# Patient Record
Sex: Female | Born: 1980 | Race: Asian | Hispanic: No | Marital: Married | Smoking: Never smoker
Health system: Southern US, Community
[De-identification: ages and names within clinical notes are randomized; demographics above are authoritative.]

## PROBLEM LIST (undated history)

## (undated) DIAGNOSIS — N809 Endometriosis, unspecified: Secondary | ICD-10-CM

## (undated) DIAGNOSIS — O98419 Viral hepatitis complicating pregnancy, unspecified trimester: Secondary | ICD-10-CM

## (undated) DIAGNOSIS — B1910 Unspecified viral hepatitis B without hepatic coma: Secondary | ICD-10-CM

## (undated) HISTORY — DX: Unspecified viral hepatitis B without hepatic coma: B19.10

## (undated) HISTORY — DX: Viral hepatitis complicating pregnancy, unspecified trimester: O98.419

## (undated) HISTORY — PX: NO PAST SURGERIES: SHX2092

---

## 2015-04-18 ENCOUNTER — Other Ambulatory Visit (HOSPITAL_COMMUNITY): Payer: Self-pay | Admitting: Obstetrics and Gynecology

## 2015-04-18 DIAGNOSIS — Z3141 Encounter for fertility testing: Secondary | ICD-10-CM

## 2015-04-21 ENCOUNTER — Ambulatory Visit (HOSPITAL_COMMUNITY)
Admission: RE | Admit: 2015-04-21 | Discharge: 2015-04-21 | Disposition: A | Discharge status: Home / Self Care | Payer: BLUE CROSS/BLUE SHIELD | Source: Ambulatory Visit | Attending: Obstetrics and Gynecology | Admitting: Obstetrics and Gynecology

## 2015-04-21 DIAGNOSIS — N979 Female infertility, unspecified: Secondary | ICD-10-CM | POA: Diagnosis present

## 2015-04-21 DIAGNOSIS — Z3141 Encounter for fertility testing: Secondary | ICD-10-CM

## 2015-04-21 MED ORDER — IOHEXOL 300 MG/ML  SOLN
30.0000 mL | Freq: Once | INTRAMUSCULAR | Status: DC | PRN
  Administered 2015-04-21: 4 mL
  Filled 2015-04-21: qty 30

## 2015-07-20 LAB — OB RESULTS CONSOLE RPR: RPR: NONREACTIVE

## 2015-07-20 LAB — OB RESULTS CONSOLE HIV ANTIBODY (ROUTINE TESTING): HIV: NONREACTIVE

## 2015-07-20 LAB — OB RESULTS CONSOLE RUBELLA ANTIBODY, IGM: Rubella: IMMUNE

## 2015-07-20 LAB — OB RESULTS CONSOLE GC/CHLAMYDIA
CHLAMYDIA, DNA PROBE: NEGATIVE
GC PROBE AMP, GENITAL: NEGATIVE

## 2015-07-24 LAB — OB RESULTS CONSOLE HEPATITIS B SURFACE ANTIGEN: Hepatitis B Surface Ag: NEGATIVE

## 2015-08-17 ENCOUNTER — Encounter: Payer: Self-pay | Admitting: Infectious Diseases

## 2015-08-17 ENCOUNTER — Ambulatory Visit (INDEPENDENT_AMBULATORY_CARE_PROVIDER_SITE_OTHER): Payer: BLUE CROSS/BLUE SHIELD | Admitting: Infectious Diseases

## 2015-08-17 ENCOUNTER — Telehealth: Payer: Self-pay | Admitting: *Deleted

## 2015-08-17 VITALS — BP 116/77 | HR 91 | Temp 98.3°F | Wt 123.0 lb

## 2015-08-17 DIAGNOSIS — B1910 Unspecified viral hepatitis B without hepatic coma: Secondary | ICD-10-CM | POA: Diagnosis not present

## 2015-08-17 DIAGNOSIS — O98419 Viral hepatitis complicating pregnancy, unspecified trimester: Secondary | ICD-10-CM | POA: Diagnosis not present

## 2015-08-17 LAB — COMPREHENSIVE METABOLIC PANEL
ALT: 14 U/L (ref 6–29)
AST: 15 U/L (ref 10–30)
Albumin: 3.7 g/dL (ref 3.6–5.1)
Alkaline Phosphatase: 33 U/L (ref 33–115)
BUN: 9 mg/dL (ref 7–25)
CO2: 19 mmol/L — ABNORMAL LOW (ref 20–31)
Calcium: 8.6 mg/dL (ref 8.6–10.2)
Chloride: 106 mmol/L (ref 98–110)
Creat: 0.53 mg/dL (ref 0.50–1.10)
Glucose, Bld: 111 mg/dL — ABNORMAL HIGH (ref 65–99)
Potassium: 3.9 mmol/L (ref 3.5–5.3)
Sodium: 137 mmol/L (ref 135–146)
Total Bilirubin: 0.3 mg/dL (ref 0.2–1.2)
Total Protein: 6.2 g/dL (ref 6.1–8.1)

## 2015-08-17 LAB — CBC
HCT: 36 % (ref 36.0–46.0)
Hemoglobin: 12.1 g/dL (ref 12.0–15.0)
MCH: 32.2 pg (ref 26.0–34.0)
MCHC: 33.6 g/dL (ref 30.0–36.0)
MCV: 95.7 fL (ref 78.0–100.0)
MPV: 11.1 fL (ref 8.6–12.4)
Platelets: 224 10*3/uL (ref 150–400)
RBC: 3.76 MIL/uL — ABNORMAL LOW (ref 3.87–5.11)
RDW: 13.6 % (ref 11.5–15.5)
WBC: 9.7 10*3/uL (ref 4.0–10.5)

## 2015-08-17 NOTE — Telephone Encounter (Signed)
Called and notified patient of her appt for ultrasound at [facility] on 08/30/15 at 6:45 AM. Nothing to eat or drink after midnight. Wendall Mola

## 2015-08-17 NOTE — Assessment & Plan Note (Signed)
She appears to be doing well Her treatment will be based on her CMP, Hep B E Ag, Ab, DNA, and u/s Will see her back in 2-3 weeks after these are completed This is explained to her and her spouse She has received flu shot.

## 2015-08-17 NOTE — Progress Notes (Signed)
   Subjective:    Patient ID: Whitney Cole, female    DOB: Feb 10, 1981, 35 y.o.   MRN: [id number]  HPI 35 yo F G1P0 currently at 10w, 4d pregnant, who was found at her OB f/u on 07-20-15 to have Hep B S Ag+.  She has known she was positive for > 38yrs.  She was born [place] came to [place] in [place]. No hx of icterus, abd pain, change in bowel or urine color.  Has been having some nausea. Sleeping poorly until recently.   PMHx, Soc, Fhx reviewed and updated in EPIC  Review of Systems  Constitutional: Negative for appetite change and unexpected weight change.  Cardiovascular: Negative for leg swelling.  Gastrointestinal: Negative for diarrhea and constipation.  Genitourinary: Negative for difficulty urinating.  Please see HPI. 12 point ROS o/w (-)      Objective:   Physical Exam  Constitutional: She appears well-developed and well-nourished.  HENT:  Mouth/Throat: No oropharyngeal exudate.  Eyes: EOM are normal. Pupils are equal, round, and reactive to light. No scleral icterus.  Neck: Neck supple.  Cardiovascular: Normal rate, regular rhythm and normal heart sounds.   Pulmonary/Chest: Effort normal and breath sounds normal.  Abdominal: Soft. Bowel sounds are normal. She exhibits no distension and no mass. There is no tenderness. There is no rebound.  Musculoskeletal: She exhibits no edema.  Lymphadenopathy:    She has no cervical adenopathy.          Assessment & Plan:

## 2015-08-18 LAB — HEPATITIS B SURFACE ANTIBODY,QUALITATIVE: HEP B S AB: NEGATIVE

## 2015-08-18 LAB — HEPATITIS B CORE ANTIBODY, TOTAL: HEP B C TOTAL AB: REACTIVE — AB

## 2015-08-21 LAB — HEPATITIS B DNA, ULTRAQUANTITATIVE, PCR
Hepatitis B DNA (Calc): <1.3 Log IU/mL (ref <1.30)
Hepatitis B DNA: <20 IU/mL (ref <20)

## 2015-08-21 LAB — HEPATITIS B E ANTIGEN: HEPATITIS BE ANTIGEN: NONREACTIVE

## 2015-08-21 LAB — HEPATITIS B E ANTIBODY: HEPATITIS BE ANTIBODY: NONREACTIVE

## 2015-08-23 ENCOUNTER — Telehealth: Payer: Self-pay | Admitting: Infectious Diseases

## 2015-08-23 NOTE — Telephone Encounter (Signed)
called pt to let her know that her Hepatitis B testing showed only a Hep B Core Ab+. This can be associated with false positives.  Her DNA is negative.  i do not believe she has hepatitis B Have asked her to cancel her u/s.  She can f/u PRN

## 2015-08-28 ENCOUNTER — Telehealth: Payer: Self-pay | Admitting: Infectious Diseases

## 2015-08-28 NOTE — Telephone Encounter (Signed)
Called pt and spoke with her and her husband.  Explained that her Hep B DNA is negative. Since she is on no hep B therapy, this indicates that she does not have hepatitis B. Her child has no risk as viral transfer is not possible without the presence of virus.  I asked that if she is still convinced that she has Hepatitis B, that she repeat her labs within 3 months.  IN reviewing Uptodate table 2A-B she does not fit in any of the positive status categories (she does not have occult DNA negative, she is eAg, S SAg negative). I let them know I am glad to talk to them anytime.

## 2015-08-30 ENCOUNTER — Ambulatory Visit (HOSPITAL_COMMUNITY): Payer: BLUE CROSS/BLUE SHIELD

## 2015-09-04 ENCOUNTER — Ambulatory Visit: Payer: BLUE CROSS/BLUE SHIELD | Admitting: Infectious Diseases

## 2016-01-25 ENCOUNTER — Encounter (HOSPITAL_COMMUNITY): Payer: Self-pay | Admitting: *Deleted

## 2016-01-25 ENCOUNTER — Inpatient Hospital Stay (HOSPITAL_COMMUNITY)
Admission: AD | Admit: 2016-01-25 | Discharge: 2016-01-29 | DRG: 782 | Disposition: A | Discharge status: Home / Self Care | Payer: BLUE CROSS/BLUE SHIELD | Source: Ambulatory Visit | Attending: Obstetrics & Gynecology | Admitting: Obstetrics & Gynecology

## 2016-01-25 DIAGNOSIS — O4400 Placenta previa specified as without hemorrhage, unspecified trimester: Secondary | ICD-10-CM

## 2016-01-25 DIAGNOSIS — O4413 Placenta previa with hemorrhage, third trimester: Principal | ICD-10-CM | POA: Diagnosis present

## 2016-01-25 DIAGNOSIS — K5900 Constipation, unspecified: Secondary | ICD-10-CM | POA: Diagnosis present

## 2016-01-25 DIAGNOSIS — O09513 Supervision of elderly primigravida, third trimester: Secondary | ICD-10-CM | POA: Diagnosis not present

## 2016-01-25 DIAGNOSIS — Z3A33 33 weeks gestation of pregnancy: Secondary | ICD-10-CM

## 2016-01-25 DIAGNOSIS — O4403 Placenta previa specified as without hemorrhage, third trimester: Secondary | ICD-10-CM | POA: Diagnosis present

## 2016-01-25 HISTORY — DX: Endometriosis, unspecified: N80.9

## 2016-01-25 LAB — URINALYSIS, ROUTINE W REFLEX MICROSCOPIC
BILIRUBIN URINE: NEGATIVE
Glucose, UA: NEGATIVE mg/dL
Ketones, ur: NEGATIVE mg/dL
LEUKOCYTES UA: NEGATIVE
NITRITE: NEGATIVE
PH: 5.5 (ref 5.0–8.0)
Protein, ur: NEGATIVE mg/dL

## 2016-01-25 LAB — TYPE AND SCREEN
ABO/RH(D): O POS
Antibody Screen: NEGATIVE

## 2016-01-25 LAB — WET PREP, GENITAL
CLUE CELLS WET PREP: NONE SEEN
Sperm: NONE SEEN
Trich, Wet Prep: NONE SEEN
Yeast Wet Prep HPF POC: NONE SEEN

## 2016-01-25 LAB — URINE MICROSCOPIC-ADD ON

## 2016-01-25 LAB — CBC
HCT: 34.5 % — ABNORMAL LOW (ref 36.0–46.0)
Hemoglobin: 11.9 g/dL — ABNORMAL LOW (ref 12.0–15.0)
MCH: 32.6 pg (ref 26.0–34.0)
MCHC: 34.5 g/dL (ref 30.0–36.0)
MCV: 94.5 fL (ref 78.0–100.0)
Platelets: 157 10*3/uL (ref 150–400)
RBC: 3.65 MIL/uL — ABNORMAL LOW (ref 3.87–5.11)
RDW: 13.3 % (ref 11.5–15.5)
WBC: 7.6 10*3/uL (ref 4.0–10.5)

## 2016-01-25 MED ORDER — PRENATAL MULTIVITAMIN CH
1.0000 | ORAL_TABLET | Freq: Every day | ORAL | Status: DC
  Administered 2016-01-26 – 2016-01-29 (×4): 1 via ORAL
  Filled 2016-01-25 (×4): qty 1

## 2016-01-25 MED ORDER — ACETAMINOPHEN 325 MG PO TABS: 650.0000 mg | ORAL_TABLET | ORAL | Status: DC | PRN

## 2016-01-25 MED ORDER — MAGNESIUM SULFATE 50 % IJ SOLN
2.5000 g/h | INTRAVENOUS | Status: AC
  Administered 2016-01-26: 2.5 g/h via INTRAVENOUS
  Filled 2016-01-25 (×3): qty 80

## 2016-01-25 MED ORDER — ZOLPIDEM TARTRATE 5 MG PO TABS: 5.0000 mg | ORAL_TABLET | Freq: Every evening | ORAL | Status: DC | PRN

## 2016-01-25 MED ORDER — LACTATED RINGERS IV BOLUS (SEPSIS)
1000.0000 mL | Freq: Once | INTRAVENOUS | Status: AC
  Administered 2016-01-25: 1000 mL via INTRAVENOUS

## 2016-01-25 MED ORDER — PRENATAL 27-0.8 MG PO TABS: 1.0000 | ORAL_TABLET | Freq: Every day | ORAL | Status: DC

## 2016-01-25 MED ORDER — MAGNESIUM SULFATE BOLUS VIA INFUSION
6.0000 g | Freq: Once | INTRAVENOUS | Status: AC
  Administered 2016-01-25: 6 g via INTRAVENOUS
  Filled 2016-01-25: qty 500

## 2016-01-25 MED ORDER — CALCIUM CARBONATE ANTACID 500 MG PO CHEW: 2.0000 | CHEWABLE_TABLET | ORAL | Status: DC | PRN

## 2016-01-25 MED ORDER — NIFEDIPINE 10 MG PO CAPS
20.0000 mg | ORAL_CAPSULE | Freq: Once | ORAL | Status: DC
  Filled 2016-01-25: qty 2

## 2016-01-25 MED ORDER — DOCUSATE SODIUM 100 MG PO CAPS
100.0000 mg | ORAL_CAPSULE | Freq: Every day | ORAL | Status: DC
  Administered 2016-01-26 – 2016-01-29 (×3): 100 mg via ORAL
  Filled 2016-01-25 (×3): qty 1

## 2016-01-25 MED ORDER — LACTATED RINGERS IV SOLN
INTRAVENOUS | Status: DC
  Administered 2016-01-27 – 2016-01-29 (×5): via INTRAVENOUS

## 2016-01-25 MED ORDER — BETAMETHASONE SOD PHOS & ACET 6 (3-3) MG/ML IJ SUSP
12.0000 mg | Freq: Once | INTRAMUSCULAR | Status: AC
  Administered 2016-01-25: 12 mg via INTRAMUSCULAR
  Filled 2016-01-25: qty 2

## 2016-01-25 MED ORDER — NIFEDIPINE 10 MG PO CAPS: 10.0000 mg | ORAL_CAPSULE | Freq: Four times a day (QID) | ORAL | Status: DC

## 2016-01-25 MED ORDER — BETAMETHASONE SOD PHOS & ACET 6 (3-3) MG/ML IJ SUSP
12.0000 mg | Freq: Once | INTRAMUSCULAR | Status: AC
  Administered 2016-01-26: 12 mg via INTRAMUSCULAR
  Filled 2016-01-25: qty 2

## 2016-01-25 MED ORDER — NIFEDIPINE 10 MG PO CAPS
10.0000 mg | ORAL_CAPSULE | ORAL | Status: DC
  Administered 2016-01-25 (×2): 10 mg via ORAL
  Filled 2016-01-25 (×2): qty 1

## 2016-01-25 NOTE — MAU Note (Signed)
Pt reports 3-4 spots of blood at home. Known previa. Denies Pain. + FM

## 2016-01-25 NOTE — MAU Provider Note (Signed)
  History     CSN: [id number]  Arrival date and time: 01/25/16 [id number]   First Provider Initiated Contact with Patient 01/25/16 2006      Chief Complaint  Patient presents with  . Vaginal Bleeding   HPI Comments: Whitney Cole is a 35 y.o. G1P0 at 8232w2d who presents today with vaginal bleeding. Patient is known previa. She states that the bleeding started just after dinner. She reports it as spotting. She states that the fetus is moving normally.   Vaginal Bleeding The patient's primary symptoms include vaginal bleeding. This is a new problem. The current episode started today. The problem occurs intermittently. The problem has been unchanged. The patient is experiencing no pain. She is pregnant. Pertinent negatives include no abdominal pain, chills, constipation, diarrhea, dysuria, fever, frequency, nausea, urgency or vomiting. The vaginal bleeding is spotting. She has not been passing clots. She has not been passing tissue. Nothing aggravates the symptoms.    Past Medical History  Diagnosis Date  . Hepatitis B affecting pregnancy   . Endometriosis     Past Surgical History  Procedure Laterality Date  . No past surgeries      Family History  Problem Relation Age of Onset  . Hepatitis B Mother   . Hepatitis B Father   . Hepatitis B Sister     Social History  Substance Use Topics  . Smoking status: Never Smoker   . Smokeless tobacco: None  . Alcohol Use: No    Allergies: No Known Allergies  Prescriptions prior to admission  Medication Sig Dispense Refill Last Dose  . Prenatal Vit-Fe Fumarate-FA (MULTIVITAMIN-PRENATAL) 27-0.8 MG TABS tablet Take 1 tablet by mouth daily at 12 noon.   01/24/2016 at Unknown time    Review of Systems  Constitutional: Negative for fever and chills.  Gastrointestinal: Negative for nausea, vomiting, abdominal pain, diarrhea and constipation.  Genitourinary: Positive for vaginal bleeding. Negative for dysuria, urgency and frequency.   Physical  Exam   Blood pressure 102/68, pulse 103, temperature 98 F (36.7 C), resp. rate 16, last menstrual period 04/15/2015.  Physical Exam  Nursing note and vitals reviewed. Constitutional: She is oriented to person, place, and time. She appears well-developed and well-nourished. No distress.  HENT:  Head: Normocephalic.  Cardiovascular: Normal rate.   Respiratory: Effort normal.  GI: Soft. There is no tenderness. There is no rebound.  Genitourinary:   External: no lesion Vagina: small amount of BRB seen Cervix: pink, smooth, visually closed  Uterus: AGA   Neurological: She is alert and oriented to person, place, and time.  Skin: Skin is warm and dry.  Psychiatric: She has a normal mood and affect.  FHT 145, moderate with 15x15 accels, no decels Toco: about q 8 min contractions with some UI.   MAU Course  Procedures  MDM 2033: D/W Dr. Dion BodyVarnado, will admit to antenatal. BMZ, procardia, fluids. CNM from practice will place admit orders.   Assessment and Plan   1. Placenta previa with hemorrhage in third trimester    Admit to antenatal CNM from [facility] will place admit orders.  Tawnya CrookHogan, Heather Donovan 01/25/2016, 8:09 PM

## 2016-01-25 NOTE — H&P (Signed)
Whitney Cole is a 35 y.o. female presenting for vaginal bleeding described as spotting  Pregnancy followed at [facility] since 4931w2d  weeks and remarkable for: Complete Placenta Previa; AMA; Type B viral hepatitis; Uterine Leimyoma   OB History    Gravida Para Term Preterm AB TAB SAB Ectopic Multiple Living   1              Past Medical History  Diagnosis Date  . Hepatitis B affecting pregnancy   . Endometriosis    Past Surgical History  Procedure Laterality Date  . No past surgeries      Family History:   family history includes Hepatitis B in her father, mother, and sister. Social History:    reports that she has never smoked. She does not have any smokeless tobacco history on file. She reports that she does not drink alcohol or use illicit drugs.   Prenatal labs: ABO, Rh:O+ Antibody-Neg Rubella Immune  RPR:   Neg HBsAg:   Positive HIV:   Negative GBS:   Collected   Prenatal Transfer Tool  Maternal Diabetes: No Genetic Screening: Normal Maternal Ultrasounds/Referrals: Normal Placenta Previa Fetal Ultrasounds or other Referrals:  None Maternal Substance Abuse:  No Significant Maternal Medications:  None Significant Maternal Lab Results: Lab values include: HBsAG positive     Blood pressure 100/68, pulse 88, temperature 97.9 F (36.6 C), temperature source Oral, resp. rate 18, last menstrual period 04/15/2015, SpO2 98 %.  General Appearance: Alert, appropriate appearance for age. No acute distress HEENT Exam: Grossly normal Chest/Respiratory Exam: Normal chest wall and respirations. Clear to auscultation Cardiovascular Exam: Regular rate and rhythm. S1, S2, no murmur Gastrointestinal Exam: soft, non-tender, Uterus gravid with size compatible with GA, Vertex presentation by Leopold's maneuvers Psychiatric Exam: Alert and oriented, appropriate affect  Vaginal exam:   External: no lesion Vagina: small amount of BRB seen Cervix: pink, smooth, visually closed   Uterus: AGA   Fetal tracings: Category 1  FHR- Basline 145 with contractions every 8 minutes with uterine irritability  ++++++++++++++++++++++++++++++++++++++++++++++++++++++++++++++++   Assessment/Plan: Admit to Antenatal Unit for Vaginal Bleeding;known Placenta Previa IUP @ 33+2 Magnesium Sulfate Steroids Repeat CBC at 1100pm EFM if contracting; NST q shift Dr Idamae SchullerVarnardo aware and consulted with on this plan of care   Lori Clemmons CNM 01/25/2016, 10:16 PM

## 2016-01-26 ENCOUNTER — Inpatient Hospital Stay (HOSPITAL_COMMUNITY): Payer: BLUE CROSS/BLUE SHIELD

## 2016-01-26 LAB — GC/CHLAMYDIA PROBE AMP (~~LOC~~) NOT AT ARMC
Chlamydia: NEGATIVE
NEISSERIA GONORRHEA: NEGATIVE

## 2016-01-26 LAB — CBC
HCT: 35.9 % — ABNORMAL LOW (ref 36.0–46.0)
Hemoglobin: 12.4 g/dL (ref 12.0–15.0)
MCH: 32.5 pg (ref 26.0–34.0)
MCHC: 34.5 g/dL (ref 30.0–36.0)
MCV: 94 fL (ref 78.0–100.0)
Platelets: 148 10*3/uL — ABNORMAL LOW (ref 150–400)
RBC: 3.82 MIL/uL — ABNORMAL LOW (ref 3.87–5.11)
RDW: 13.3 % (ref 11.5–15.5)
WBC: 9.6 10*3/uL (ref 4.0–10.5)

## 2016-01-26 LAB — ABO/RH: ABO/RH(D): O POS

## 2016-01-26 MED ORDER — NIFEDIPINE 10 MG PO CAPS
10.0000 mg | ORAL_CAPSULE | Freq: Three times a day (TID) | ORAL | Status: DC
  Administered 2016-01-26: 10 mg via ORAL
  Filled 2016-01-26: qty 1

## 2016-01-26 MED ORDER — NIFEDIPINE 10 MG PO CAPS
10.0000 mg | ORAL_CAPSULE | Freq: Four times a day (QID) | ORAL | Status: DC
  Administered 2016-01-26: 10 mg via ORAL
  Filled 2016-01-26: qty 1

## 2016-01-26 NOTE — Progress Notes (Addendum)
Patient ID: Whitney Cole, female   DOB: 08-29-1980, 35 y.o.   MRN: [id number] Whitney Cole is a 35 y.o. G1P0 at 499w3d admitted for 3rd trimester bleeding with known previa  Subjective: No further bleeding since last night.  Contractions significantly less intense than yesterday on MgSO4.  She feels a cramp maybe q5915min.  Objective: BP 114/78 mmHg  Pulse 90  Temp(Src) 97.3 F (36.3 C) (Oral)  Resp 20  Ht 5' 4.17" (1.63 m)  Wt 142 lb (64.411 kg)  BMI 24.24 kg/m2  SpO2 98%  LMP 04/15/2015 I/O last 3 completed shifts: In: 1427.9 [P.O.:120; I.V.:1307.9] Out: 2300 [Urine:2300] Total I/O In: 420 [P.O.:120; I.V.:300] Out: 300 [Urine:300]  Physical Exam:  Gen: alert Chest/Lungs: cta bilaterally  Heart/Pulse: RRR  Abdomen: soft, gravid, nontender Uterine fundus: soft, nontender Skin & Color: warm and dry  EXT: negative Homan's b/l, edema none  FHT:  FHR: 120s bpm, variability: moderate,  accelerations:  Present,  decelerations:  Absent UC:   About q598min mild SVE:    deferred  Labs: Lab Results  Component Value Date   WBC 9.6 01/26/2016   HGB 12.4 01/26/2016   HCT 35.9* 01/26/2016   MCV 94.0 01/26/2016   PLT 148* 01/26/2016   EFW 4lbs 6oz on 7/6 with nl fluid, vtx and post complete placenta previa  Assessment and Plan:  does not have any active problems on file. P0 at 3 3/7wks with known previa admitted with bleed.  Stable on MgSO4. Awaiting neonatology consult Cont close observation I discussed delivery at 36wks and bedrest until then   [facility] IncROBERTS,Flornce Record Y 01/26/2016, 11:02 AM

## 2016-01-26 NOTE — Progress Notes (Signed)
Admission nutrition screen triggered for unintentional weight loss > 10 lbs within the last month. PNR indicates current weight is 2 lbs less than weight 1 month ago. Patients chart reviewed and assessed  for nutritional risk. Patient is determined to be at low nutrition  risk.  Elisabeth CaraKatherine Nabil Bubolz M.Odis LusterEd. R.D. LDN Neonatal Nutrition Support Specialist/RD III Pager [phone]      Phone [phone]

## 2016-01-26 NOTE — Progress Notes (Signed)
CNM called to check on patients status. Informed that patient was still having contractions about every 8-12 minutes and was feeling the contractions. No bleeding noted. CNM gave verbal order to administer Procardia this am now. Procardia administered per order.

## 2016-01-26 NOTE — Progress Notes (Signed)
Tracing reviewed by K. Mayford KnifeWilliams, CNM.  Issue (sinusoidal appearing pattern) with FHR tracing resolved and looks WNL.

## 2016-01-26 NOTE — Progress Notes (Signed)
Signing out to Dr. Idamae SchullerVarnardo. Discussed discontinuing Procardia with Magnesium Sulfate. Procardia dose was held during the night. Procardia will be discontinued.

## 2016-01-26 NOTE — Progress Notes (Signed)
Called Antepartum unit and spoke with RN. Told to not give Procardia AM dose. States she already has. Orders to discontinue procardia. Pt is having uterine irritability and contractions q 8 minutes and states she is feeling them

## 2016-01-27 DIAGNOSIS — O4403 Placenta previa specified as without hemorrhage, third trimester: Secondary | ICD-10-CM | POA: Diagnosis present

## 2016-01-27 LAB — CULTURE, BETA STREP (GROUP B ONLY)

## 2016-01-27 MED ORDER — POLYETHYLENE GLYCOL 3350 17 G PO PACK
17.0000 g | PACK | Freq: Every day | ORAL | Status: DC
  Administered 2016-01-27: 17 g via ORAL
  Filled 2016-01-27 (×2): qty 1

## 2016-01-27 NOTE — Progress Notes (Signed)
Patient ID: Whitney Cole, female   DOB: Apr 05, 1981, 35 y.o.   MRN: [id number] Whitney Cole is a 35 y.o. G1P0 at [redacted]w[redacted]d admitted for 3rd trimester bleeding with known placenta previa  Subjective: She has had no further bright red bleeding since admission.  She reports slight brown discharge overnight, but minimal.  No LOF, no vaginal bleeding, no contractions/abdominal pain.  +FM.  No headache/blurry vision.  No nausea/vomiting.  Pt has not had a BM in 2 days and normally goes daily- she wasn't sure if she feels constipated now.  Otherwise doing well and reports no acute complaints.   Objective: BP 109/72 mmHg  Pulse 86  Temp(Src) 97.3 F (36.3 C) (Oral)  Resp 18  Ht 5' 4.17" (1.63 m)  Wt 64.411 kg (142 lb)  BMI 24.24 kg/m2  SpO2 98%  LMP 04/15/2015 I/O last 3 completed shifts: In: 5675.8 [P.O.:1200; I.V.:4475.8] Out: 5850 [Urine:5850] Total I/O In: 131.3 [I.V.:131.3] Out: 300 [Urine:300]  Physical Exam:  Gen: alert Chest/Lungs: cta bilaterally  Heart/Pulse: RRR  Abdomen: soft, gravid, nontender Uterine fundus: soft, nontender Skin & Color: warm and dry  EXT: negative Homan's b/l, edema none  FHT:  FHR: 120s bpm, variability: moderate,  accelerations:  Present,  decelerations:  Absent UC:   +irritatbility, no contractions appreciated SVE:    deferred  Labs: Lab Results  Component Value Date   WBC 9.6 01/26/2016   HGB 12.4 01/26/2016   HCT 35.9* 01/26/2016   MCV 94.0 01/26/2016   PLT 148* 01/26/2016   On 7/21: EFW 4lbs 6oz on 7/6 with nl fluid, vtx and post complete placenta previa  Assessment and Plan: 35yo G1P0@[redacted]w[redacted]d  admitted for placenta previa with episode of bleeding -bleeding has now resolved, will continue to closely monitor -preterm contractions: on Mag x 48hr while completing BMZ course- last dose on 01/26/16.  Will continue Mag until 9pm this evening Awaiting neonatology consult -Constipation?: Colace twice daily as needed Plan for  delivery at 36wks and  bedrest until then  DISPO: Will monitor x 24hr following D/C of Mag this evening.  If remains stable with no contractions or bleeding will consider discharge home on Monday.  Myna Hidalgo, M 01/27/2016, 8:29 AM

## 2016-01-28 LAB — TYPE AND SCREEN
ABO/RH(D): O POS
ANTIBODY SCREEN: NEGATIVE

## 2016-01-28 MED ORDER — NIFEDIPINE 10 MG PO CAPS
10.0000 mg | ORAL_CAPSULE | Freq: Four times a day (QID) | ORAL | Status: DC | PRN
  Administered 2016-01-28: 10 mg via ORAL
  Filled 2016-01-28: qty 1

## 2016-01-28 NOTE — Progress Notes (Signed)
Whitney Cole is a 35 y.o. G1P0 at [redacted]w[redacted]d Admittted with first episode of vaginal bleeding due to posterior placenta previa  Subjective: She is without complaints this morning. She denies any vaginal bleeding. No contractions no lOF +FM    Objective: BP 114/76 (BP Location: Left Arm)   Pulse 81   Temp 98 F (36.7 C) (Oral)   Resp 16   Ht 5' 4.17" (1.63 m)   Wt 142 lb (64.4 kg)   LMP 04/15/2015   SpO2 98%   BMI 24.24 kg/m  I/O last 3 completed shifts: In: 8578.8 [P.O.:3940; I.V.:4638.8] Out: 6100 [Urine:6100] No intake/output data recorded.  FHT NST for this morning pending.  Toco : Pending   General Alert and Oriented no acute distress  Abdomen Gravid non-tender  Lungs No distress .Marland Kitchen Good effort Extremities no edema   Labs: Lab Results  Component Value Date   WBC 9.6 01/26/2016   HGB 12.4 01/26/2016   HCT 35.9 (L) 01/26/2016   MCV 94.0 01/26/2016   PLT 148 (L) 01/26/2016    Assessment / Plan: 33 wks and 5 days with posterior placenta previa admitted with first episode of bleeding.  - Bleeding has now resolved  BMZ completed If no bleeding occurs consider discharge home tomorrow on bedrest.   -Preterm contractions- resolved s/p Magnesium sulfate .Marland Kitchen If they reoccur plan to treat with procardia.  [facility] physician Dr. Sallye Ober to assume care starting at 7am on 01/29/2016   Whitney Cole J. 01/28/2016, 9:27 AM

## 2016-01-29 MED ORDER — NIFEDIPINE 10 MG PO CAPS: 10.0000 mg | ORAL_CAPSULE | Freq: Four times a day (QID) | ORAL | 0 refills | Status: DC | PRN

## 2016-01-29 NOTE — Discharge Summary (Signed)
OB Discharge Summary     Patient Name: Whitney Cole DOB: [birth year]   MRN: [id number]  Date of admission: 01/25/2016  Date of discharge: 01/29/2016  Admitting diagnosis: 33w bleeding, placenta previa Intrauterine pregnancy: [redacted]w[redacted]d     Secondary diagnosis:  Active Problems:   Placenta previa antepartum in third trimester     Discharge diagnosis: 33w 6 days EGA, Placenta previa.                                     Hospital course:  35 y/o G1P0 with placenta previa who presented with a vaginal bleeding episode.  Her bleeding resolved after admission and she had no further vaginal bleeding after her day of admission.  She had some preterm contractions during her admission that resolved with Procardia and magnesium sulfate.  She received betamethasone for fetal lung maturity.  On HD # 5 she remained stable and without further bleed and she was deemed stable for discharge. I reviewed strict preterm labor and vaginal bleeding precautions as well as fetal kick counts.  We discussed that plan was for delivery at [redacted] weeks EGA if no further bleeding, she will follow up at office for OR scheduling.        Physical exam  Vitals:   01/28/16 2036 01/29/16 0128 01/29/16 0802 01/29/16 1157  BP: 108/73  109/74 105/75  Pulse: 71  97 87  Resp: 18 18 16 16   Temp: 97.9 F (36.6 C) 98.2 F (36.8 C) 98.3 F (36.8 C) 98.8 F (37.1 C)  TempSrc: Oral Oral Oral Oral  SpO2:      Weight:      Height:       General: alert, cooperative and no distress  CVS: S1, s2, RRR Pulm: CTAB Uterus: Non tender, gravid Extremities: warm and well perfused.   No edema, no calf tenderness.  DVT Evaluation: No evidence of DVT seen on physical exam. Underwear: No blood.   Labs:  Blood group O pos.   Lab Results  Component Value Date   WBC 9.6 01/26/2016   HGB 12.4 01/26/2016   HCT 35.9 (L) 01/26/2016   MCV 94.0 01/26/2016   PLT 148 (L) 01/26/2016   CMP Latest Ref Rng & Units 08/17/2015  Glucose 65 - 99 mg/dL  409(W)  BUN 7 - 25 mg/dL 9  Creatinine 1.19 - 1.47 mg/dL 8.29  Sodium 562 - 130 mmol/L 137  Potassium 3.5 - 5.3 mmol/L 3.9  Chloride 98 - 110 mmol/L 106  CO2 20 - 31 mmol/L 19(L)  Calcium 8.6 - 10.2 mg/dL 8.6  Total Protein 6.1 - 8.1 g/dL 6.2  Total Bilirubin 0.2 - 1.2 mg/dL 0.3  Alkaline Phos 33 - 115 U/L 33  AST 10 - 30 U/L 15  ALT 6 - 29 U/L 14    Discharge instruction: Placenta previa, preterm contractions.   After visit meds:    Medication List    TAKE these medications   multivitamin-prenatal 27-0.8 MG Tabs tablet Take 1 tablet by mouth daily at 12 noon.   NIFEdipine 10 MG capsule Commonly known as:  PROCARDIA Take 1 capsule (10 mg total) by mouth every 6 (six) hours as needed (contractions).       Diet: routine diet  Activity: Pelvic rest Modified bed rest- spend as much time as possible resting in bed, but may get up to do activities of daily living such as using bathroom,  cleaning herself.    Outpatient follow up:11 days a already scheduled (Aug 3rd 2017)  Disposition:Home with partner.  Patient states she lives about 10 minutes drive from hospital.    7/82/9562 Konrad Felix, MD

## 2016-02-03 ENCOUNTER — Inpatient Hospital Stay (HOSPITAL_COMMUNITY): Payer: BLUE CROSS/BLUE SHIELD

## 2016-02-03 ENCOUNTER — Encounter (HOSPITAL_COMMUNITY): Payer: Self-pay | Admitting: Emergency Medicine

## 2016-02-03 ENCOUNTER — Inpatient Hospital Stay (HOSPITAL_COMMUNITY)
Admission: AD | Admit: 2016-02-03 | Discharge: 2016-02-15 | DRG: 765 | Disposition: A | Discharge status: Home / Self Care | Payer: BLUE CROSS/BLUE SHIELD | Source: Ambulatory Visit | Attending: Obstetrics & Gynecology | Admitting: Obstetrics & Gynecology

## 2016-02-03 ENCOUNTER — Other Ambulatory Visit: Payer: Self-pay | Admitting: Obstetrics and Gynecology

## 2016-02-03 DIAGNOSIS — O4413 Placenta previa with hemorrhage, third trimester: Secondary | ICD-10-CM

## 2016-02-03 DIAGNOSIS — Z3A34 34 weeks gestation of pregnancy: Secondary | ICD-10-CM

## 2016-02-03 DIAGNOSIS — O328XX0 Maternal care for other malpresentation of fetus, not applicable or unspecified: Secondary | ICD-10-CM | POA: Diagnosis present

## 2016-02-03 DIAGNOSIS — O09529 Supervision of elderly multigravida, unspecified trimester: Secondary | ICD-10-CM

## 2016-02-03 DIAGNOSIS — O4403 Placenta previa specified as without hemorrhage, third trimester: Principal | ICD-10-CM | POA: Diagnosis present

## 2016-02-03 DIAGNOSIS — O4410 Placenta previa with hemorrhage, unspecified trimester: Secondary | ICD-10-CM | POA: Diagnosis present

## 2016-02-03 DIAGNOSIS — O4400 Placenta previa specified as without hemorrhage, unspecified trimester: Secondary | ICD-10-CM

## 2016-02-03 DIAGNOSIS — Z98891 History of uterine scar from previous surgery: Secondary | ICD-10-CM

## 2016-02-03 LAB — CBC
HCT: 39.2 % (ref 36.0–46.0)
Hemoglobin: 13.9 g/dL (ref 12.0–15.0)
MCH: 33 pg (ref 26.0–34.0)
MCHC: 35.5 g/dL (ref 30.0–36.0)
MCV: 93.1 fL (ref 78.0–100.0)
Platelets: 174 K/uL (ref 150–400)
RBC: 4.21 MIL/uL (ref 3.87–5.11)
RDW: 13.4 % (ref 11.5–15.5)
WBC: 10.6 K/uL — ABNORMAL HIGH (ref 4.0–10.5)

## 2016-02-03 LAB — PREPARE RBC (CROSSMATCH)

## 2016-02-03 MED ORDER — LACTATED RINGERS IV SOLN
INTRAVENOUS | Status: DC
  Administered 2016-02-03: 125 mL/h via INTRAVENOUS
  Administered 2016-02-03 – 2016-02-13 (×25): via INTRAVENOUS

## 2016-02-03 MED ORDER — SODIUM CHLORIDE 0.9 % IV SOLN: Freq: Once | INTRAVENOUS | Status: DC

## 2016-02-03 MED ORDER — DOCUSATE SODIUM 100 MG PO CAPS
100.0000 mg | ORAL_CAPSULE | Freq: Every day | ORAL | Status: DC
  Administered 2016-02-06 – 2016-02-12 (×3): 100 mg via ORAL
  Filled 2016-02-03 (×6): qty 1

## 2016-02-03 MED ORDER — ZOLPIDEM TARTRATE 5 MG PO TABS: 5.0000 mg | ORAL_TABLET | Freq: Every evening | ORAL | Status: DC | PRN

## 2016-02-03 MED ORDER — NIFEDIPINE 10 MG PO CAPS
10.0000 mg | ORAL_CAPSULE | Freq: Four times a day (QID) | ORAL | Status: DC
  Administered 2016-02-03 – 2016-02-13 (×41): 10 mg via ORAL
  Filled 2016-02-03 (×47): qty 1

## 2016-02-03 MED ORDER — CALCIUM CARBONATE ANTACID 500 MG PO CHEW: 2.0000 | CHEWABLE_TABLET | ORAL | Status: DC | PRN

## 2016-02-03 MED ORDER — ACETAMINOPHEN 325 MG PO TABS: 650.0000 mg | ORAL_TABLET | ORAL | Status: DC | PRN

## 2016-02-03 MED ORDER — PRENATAL MULTIVITAMIN CH
1.0000 | ORAL_TABLET | Freq: Every day | ORAL | Status: DC
  Administered 2016-02-03 – 2016-02-12 (×10): 1 via ORAL
  Filled 2016-02-03 (×10): qty 1

## 2016-02-03 NOTE — Progress Notes (Addendum)
  Subjective: Resting comfortably--no active bleeding at present.  Objective: BP 122/79   Pulse 64   Temp 97.8 F (36.6 C) (Oral)   Resp 18   Ht 5\' 4"  (1.626 m)   Wt 64.4 kg (142 lb)   LMP 04/15/2015   BMI 24.37 kg/m  No intake/output data recorded. No intake/output data recorded.   Current pad on x 2 hours--scattered drops of dark red spotting, no active bleeding visualized.  FHT: Category 1 UC:   q 8-12 min, mild.  Received Procardia 10 mg at 0434.  Results for orders placed or performed during the hospital encounter of 02/03/16 (from the past 24 hour(s))  CBC on admission     Status: Abnormal   Collection Time: 02/03/16  3:35 AM  Result Value Ref Range   WBC 10.6 (H) 4.0 - 10.5 K/uL   RBC 4.21 3.87 - 5.11 MIL/uL   Hemoglobin 13.9 12.0 - 15.0 g/dL   HCT 55.7 32.2 - 02.5 %   MCV 93.1 78.0 - 100.0 fL   MCH 33.0 26.0 - 34.0 pg   MCHC 35.5 30.0 - 36.0 g/dL   RDW 42.7 06.2 - 37.6 %   Platelets 174 150 - 400 K/uL  Type and screen     Status: None   Collection Time: 02/03/16  3:35 AM  Result Value Ref Range   ABO/RH(D) O POS    Antibody Screen NEG    Sample Expiration 02/06/2016     Assessment:  Previa with 2nd bleeding episode Stable maternal/fetal status  Plan: Continue current care. Will consult with Dr. Normand Sloop regarding contractions.  Nigel Bridgeman CNM 02/03/2016, 6:58 AM

## 2016-02-03 NOTE — Progress Notes (Addendum)
Pt without complaints.  No leakage of fluid or VB.  Good FM.  The bleeding has stopped  BP 121/79   Pulse 77   Temp 98.2 F (36.8 C) (Oral)   Resp 18   Ht 5\' 4"  (1.626 m)   Wt 142 lb (64.4 kg)   LMP 04/15/2015   BMI 24.37 kg/m   FHTS Decelerations: category 1  Toco regular, every 8-10 minutes  Pt in NAD CV RRR Lungs CTAB abd  Gravid soft and NT GU no vb EXt no calf tenderness Results for orders placed or performed during the hospital encounter of 02/03/16 (from the past 72 hour(s))  CBC on admission     Status: Abnormal   Collection Time: 02/03/16  3:35 AM  Result Value Ref Range   WBC 10.6 (H) 4.0 - 10.5 K/uL   RBC 4.21 3.87 - 5.11 MIL/uL   Hemoglobin 13.9 12.0 - 15.0 g/dL   HCT 29.9 37.1 - 69.6 %   MCV 93.1 78.0 - 100.0 fL   MCH 33.0 26.0 - 34.0 pg   MCHC 35.5 30.0 - 36.0 g/dL   RDW 78.9 38.1 - 01.7 %   Platelets 174 150 - 400 K/uL  Type and screen     Status: None (Preliminary result)   Collection Time: 02/03/16  3:35 AM  Result Value Ref Range   ABO/RH(D) O POS    Antibody Screen NEG    Sample Expiration 02/06/2016    Unit Number [id number]    Blood Component Type RED CELLS,LR    Unit division 00    Status of Unit ALLOCATED    Transfusion Status OK TO TRANSFUSE    Crossmatch Result Compatible    Unit Number [id number]    Blood Component Type RED CELLS,LR    Unit division 00    Status of Unit ALLOCATED    Transfusion Status OK TO TRANSFUSE    Crossmatch Result Compatible   Prepare RBC     Status: None   Collection Time: 02/03/16  8:00 AM  Result Value Ref Range   Order Confirmation ORDER PROCESSED BY BLOOD BANK     Assessment and Plan [redacted]w[redacted]d  Placenta previa Preliminary [place] confirmed it Type and cross for 2 units Plan CS at 36 weeks or if bleeding restarts.  It is scheduled for 8/8

## 2016-02-03 NOTE — Progress Notes (Signed)
Admission nutrition screen triggered for unintentional weight loss > 10 lbs within the last month. Pt weight per PNR on 6/27 was 144 lbs. Patients chart reviewed and assessed  for nutritional risk. Patient is determined to be at low nutrition  risk.   Whitney Cole LDN Neonatal Nutrition Support Specialist/RD III Pager [phone]      Phone [phone]

## 2016-02-03 NOTE — H&P (Signed)
Whitney Cole is a 35 y.o. female, G1P0 at 46 4/7 weeks, presenting for 2nd bleeding episode from known complete previa.  Reports noted bleeding upon awakening, with small amount BRB noted.  Reports mild cramping, "less than last week".  Denies recent IC, reports +FM.  Admitted 7/20-7/24 for initial episode of bleeding--received betamethasone course 7/20 and 7/21.  On magnesium IV, then procardia po.  Home after bleeding resolved.  Plan for C/S at 36 weeks.  Last growth [place] on 7/21 via MFM:  EFW 2396 g, 5+5, 76%ile, AFI 14.4, 51%ile, vtx, posterior previa.  Seen by ID 08/2015 due to positive Hep B surface antigen at NOB:   Per Dr. Moshe Cipro note of 08/23/15:  "called pt to let her know that her Hepatitis B testing showed only a Hep B Core Ab+. This can be associated with false positives.  Her DNA is negative.  i do not believe she has hepatitis B"  Patient Active Problem List   Diagnosis Date Noted  . Antepartum hemorrhage from placenta previa 02/03/2016  . AMA (advanced maternal age) multigravida 35+--normal genetic screening 02/03/2016  . Placenta previa antepartum in third trimester 01/27/2016     History of present pregnancy: Patient entered care at 6 2/7 weeks.   EDC of 03/12/16 was established by LMP, in agreement with [place] at 7 1/7 weeks.  [facility] noted on early US Anatomy scan:  20 weeks, with normal anatomy and a complete placenta previa.   Additional [place] evaluations:   27 weeks:  Previa persisting, normal growth, normal fluid, cervix closed 31 2/7 weeks:  Previa persisting, EFW 77%ile, normal fluid, cervix closed, vtx. Significant prenatal events:  Previa noted on anatomy US, persisted throughout pregnancy.  Initial bleeding episode 7/20. Normal genetic screening.  Received betamethsone 7/20 and 7/21.  Referred to ID clinic due to positive Hep B, and patient's report of  Hep B "since I was born" .  Per ID testing, they feel patient is a false positive. Last evaluation: 01/24/16 at office, 7/24  on d/c from antenatal admission.  OB History    Gravida Para Term Preterm AB Living   1             SAB TAB Ectopic Multiple Live Births                 Past Medical History:  Diagnosis Date  . Endometriosis   . Hepatitis B affecting pregnancy    Past Surgical History:  Procedure Laterality Date  . NO PAST SURGERIES     Family History: family history includes Hepatitis B in her father, mother, and sister.   Social History:  reports that she has never smoked. She does not have any smokeless tobacco history on file. She reports that she does not drink alcohol or use drugs.  Patient is [place], post-graduate educated, currently unemployed, married to [place], who is involved and supportive.   Prenatal Transfer Tool  Maternal Diabetes: No Genetic Screening: Normal Panorama and AFP Maternal Ultrasounds/Referrals: Abnormal:  Findings:   Other: complete previa Fetal Ultrasounds or other Referrals:  Referred to Materal Fetal Medicine --WNL Maternal Substance Abuse:  No Significant Maternal Medications:  None--Received betamethasone course 7/20-7/21 Significant Maternal Lab Results: Lab values include: Group B Strep negative, initial HBsAG positive, but hepatitis testing at ID Clinic negative  TDAP NA Flu 08/11/15  ROS:  Small amount dark blood on pad, mild cramping, good FM  No Known Allergies     Resp. rate 18, height 5\' 4"  (  1.626 m), weight 64.4 kg (142 lb), last menstrual period 04/15/2015.  Chest clear Heart RRR without murmur Abd gravid, NT, FH 35 cm Pelvic: Deferred, small trickle of BRB on pad Ext: WNL  FHR: Category 1 UCs:  q 8 min, mild  Prenatal labs: ABO, Rh: --/--/O POS (07/23 1957) Antibody: NEG (07/23 1957) Rubella:  Immune RPR:   NR HBsAg:   Positive, but negative further Hep B testing HIV:   NR GBS:  Negative 01/25/16 Sickle cell/Hgb electrophoresis:  AA Pap:  Normal 03/2015 GC:  Negative 07/20/15 Chlamydia:  Negative 07/20/15 Genetic  screenings:  Normal Panorama and AFP Glucola:  WNL Other:   Hgb 12.9 at NOB, 12.4 at 28 weeks, 12.4 on 7/21   Assessment/Plan: IUP at 34 4/7 weeks Complete previa, with 2nd bleeding episode--hemodynamically stable. ID does not feel she has Hep B--false positive HBsAG AMA--normal screening Received betamethasone course 7/20 and 01/26/16.  Plan: Admit to [facility] per consult with Dr. Sallye Ober. IV access CBC, T&S Procardia 10 mg po q 6 hours Limited OB US Close observation of bleeding, maternal/fetal status.  Advised patient and husband that heavy bleeding or any evidence of maternal/fetal compromise would require proceeding to C/S; she may require continued hospitalization until delivery.  Porcha Deblanc CNM, MN 02/03/2016, 4:08 AM   Addendum: US--vtx, AFI 11.82, 33%ile, posterior previa.  Will continue close observation of maternal/fetal status.  Nigel Bridgeman, CNM 02/03/16 [phone]

## 2016-02-04 ENCOUNTER — Other Ambulatory Visit (HOSPITAL_COMMUNITY): Payer: Self-pay | Admitting: Radiology

## 2016-02-04 NOTE — Progress Notes (Addendum)
Whitney Cole, Whitney Cole Female, 35 y.o., March 31, 1981  G1 P0 at 34 weeks for 5 days EGA with placenta previa, second admission for vaginal bleeding,  Subjective: Patient reports no complaints. She notices a notices small dark brown blood on peri-pad but no active vaginal bleeding. She denies any abdominal contractions or leakage of fluid. With normal gross fetal movement.  Objective: Blood pressure 118/81, pulse 89, temperature 98.5 F (36.9 C), temperature source Oral, resp. rate 16, height 5\' 4"  (1.626 m), weight 64.4 kg (142 lb), last menstrual period 04/15/2015.  General: alert, cooperative and no distress Resp: clear to auscultation bilaterally Cardio: regular rate and rhythm, S1, S2 normal, no murmur, click, rub or gallop GI: soft, non-tender; bowel sounds normal; no masses,  no organomegaly Extremities: extremities normal, atraumatic, no cyanosis or edema and no edema, redness or tenderness in the calves or thighs Vaginal Bleeding: none  EFM@ 11.30am 02/04/16: 140 BL, mod variability, reactive.  TOCO: + irritability.    CBC    Component Value Date/Time   WBC 10.6 (H) 02/03/2016 0335   RBC 4.21 02/03/2016 0335   HGB 13.9 02/03/2016 0335   HCT 39.2 02/03/2016 0335   PLT 174 02/03/2016 0335   MCV 93.1 02/03/2016 0335   MCH 33.0 02/03/2016 0335   MCHC 35.5 02/03/2016 0335   RDW 13.4 02/03/2016 0335   . sodium chloride   Intravenous Once  . docusate sodium  100 mg Oral Daily  . NIFEdipine  10 mg Oral Q6H  . prenatal multivitamin  1 tablet Oral Q1200    Assessment/Plan:  35 year old G1 P0 at 34 weeks for 5 days EGA with placenta previa, second admission for vaginal bleeding, Stable maternal and fetal status  Continue with close monitoring of fetus and mother  Procardia prn contractions  Discussed with patient delivery plan, in-patient hospital stay. All questions were answered  She has a clinical diagnosis of endometriosis and desires for that to be confirmed during the cesarean  section.     LOS: 1 day    [facility] 02/04/2016, 2:26 PM

## 2016-02-05 ENCOUNTER — Other Ambulatory Visit (HOSPITAL_COMMUNITY): Payer: BLUE CROSS/BLUE SHIELD

## 2016-02-05 MED ORDER — NIFEDIPINE 10 MG PO CAPS
10.0000 mg | ORAL_CAPSULE | ORAL | Status: AC
  Administered 2016-02-05: 10 mg via ORAL

## 2016-02-05 NOTE — Progress Notes (Addendum)
Patient ID: Whitney Cole, female   DOB: Nov 26, 1980, 35 y.o.   MRN: [id number] Whitney Cole is a 35 y.o. G1P0 at [redacted]w[redacted]d admitted for 2nd bleeding episode with known post previa  Subjective: Small amount of brown discharge last night.  Denies feeling any contractions.No VB, no LOF and good FM.  Pt being ok with delivery at 36wks.  S/p additional dose of procardia secondary to slight increase in frequency of contractions.  Reports small voids but no dysuria.  Objective: BP 103/68   Pulse 96   Temp 98.3 F (36.8 C)   Resp 16   Ht 5\' 4"  (1.626 m)   Wt 142 lb (64.4 kg)   LMP 04/15/2015   BMI 24.37 kg/m  No intake/output data recorded. No intake/output data recorded.  Physical Exam:  Gen: alert Chest/Lungs: cta bilaterally  Heart/Pulse: RRR  Abdomen: soft, gravid, nontender, BX x4 quad Uterine fundus: soft, nontender Skin & Color: warm and dry  EXT: negative Homan's b/l, edema none  FHT:  FHR: 140s-150s bpm, variability: moderate,  accelerations:  Present,  decelerations:  Absent UC:   occas SVE:    deferred  Labs: Lab Results  Component Value Date   WBC 10.6 (H) 02/03/2016   HGB 13.9 02/03/2016   HCT 39.2 02/03/2016   MCV 93.1 02/03/2016   PLT 174 02/03/2016    Assessment and Plan: has Placenta previa antepartum in third trimester; Antepartum hemorrhage from placenta previa; and AMA (advanced maternal age) multigravida 35+--normal genetic screening on her problem list. Currently stable Cont observation in hospital with delivery on 8/8 Last EFW on 01/26/16 5lbs 5oz 76%, nl fluid SCDs for DVT prophylaxis Procardia for contractions S/p BMZ Urine to Cx Fetal status is overall reassuring  Whitney Cole Y 02/05/2016, 2:14 PM

## 2016-02-06 ENCOUNTER — Other Ambulatory Visit: Payer: Self-pay | Admitting: Obstetrics and Gynecology

## 2016-02-06 LAB — TYPE AND SCREEN
ABO/RH(D): O POS
ANTIBODY SCREEN: NEGATIVE

## 2016-02-06 LAB — CULTURE, OB URINE

## 2016-02-06 NOTE — Progress Notes (Addendum)
Cole, Whitney Female, 35 y.o., 01/10/1981  G1 P0 at [redacted] weeks EGA with posterior placenta previa, second admission for vaginal bleeding   Subjective: Patient reports no complaints.  Feels some mild irregular contractions.  She denies any recent vaginal bleeding or leakage of fluid. With normal gross fetal movement.    Objective: I have reviewed patient's vital signs and medications. Blood pressure 104/78, pulse 93, temperature 98 F (36.7 C), resp. rate 16, height 5\' 4"  (1.626 m), weight 64.4 kg (142 lb), last menstrual period 04/15/2015.  General: alert, cooperative and no distress Resp: clear to auscultation bilaterally Cardio: regular rate and rhythm, S1, S2 normal, no murmur, click, rub or gallop GI: soft, non-tender; bowel sounds normal; no masses,  no organomegaly Extremities: extremities normal, atraumatic, no cyanosis or edema Vaginal Bleeding: minimal   Lower extremities with 1+ edema bilaterally, no evidence of DVTs.   Scheduled meds:   . sodium chloride   Intravenous Once  . docusate sodium  100 mg Oral Daily  . NIFEdipine  10 mg Oral Q6H  . prenatal multivitamin  1 tablet Oral Q1200    Assessment/Plan: Posterior placenta previa, second admission for vaginal bleeding, Stable maternal and fetal status,  -continue current care -status post betamethasone -Procardia for contractions -Bloo type and screen Q 3 days -Plan is for delivery at [redacted] weeks EGA on 02/13/2016 -Reviewed postpartum plan -Urine culture pending.    LOS: 3 days    [facility] 02/06/2016, 10:44 AM

## 2016-02-07 LAB — TYPE AND SCREEN
ABO/RH(D): O POS
Antibody Screen: NEGATIVE
UNIT DIVISION: 0
UNIT DIVISION: 0
Unit division: 0
Unit division: 0

## 2016-02-07 NOTE — Progress Notes (Signed)
Patient ID: Whitney Cole, female   DOB: February 24, 1981, 35 y.o.   MRN: [id number] Pt without complaints.  No leakage of fluid or VB.  Good FM  BP 115/80   Pulse 91   Temp 98 F (36.7 C)   Resp 16   Ht 5\' 4"  (1.626 m)   Wt 147 lb 1.6 oz (66.7 kg)   LMP 04/15/2015   BMI 25.25 kg/m   FHTS cat 1  Toco regular, every 7-10 minutes  Pt in NAD CV RRR Lungs CTAB abd  Gravid soft and NT GU no vb EXt no calf tenderness Results for orders placed or performed during the hospital encounter of 02/03/16 (from the past 72 hour(s))  OB Urine Culture     Status: Abnormal   Collection Time: 02/05/16  3:55 PM  Result Value Ref Range   Specimen Description OB CLEAN CATCH    Special Requests NONE    Culture (A)     MULTIPLE SPECIES PRESENT, SUGGEST RECOLLECTION NO GROUP B STREP (S.AGALACTIAE) ISOLATED Performed at [facility]    Report Status 02/06/2016 FINAL   Type and screen [facility]     Status: None   Collection Time: 02/06/16  7:50 AM  Result Value Ref Range   ABO/RH(D) O POS    Antibody Screen NEG    Sample Expiration 02/09/2016     Assessment and Plan [redacted]w[redacted]d  Placenta previa with contractions Continue procardia  Plan CS at 36 weeks or if she has bleeding again

## 2016-02-08 LAB — CULTURE, OB URINE

## 2016-02-08 MED ORDER — NIFEDIPINE 10 MG PO CAPS
10.0000 mg | ORAL_CAPSULE | Freq: Once | ORAL | Status: AC
  Administered 2016-02-08 (×2): 10 mg via ORAL
  Filled 2016-02-08: qty 1

## 2016-02-08 NOTE — Progress Notes (Signed)
Whitney Cole, Whitney Cole Female, 35 y.o., [birth year]  G1 P0 at 35 weeks 2 days EGA with posterior placenta previa, second admission for vaginal bleeding  Subjective:  Patient reports feeling mild contractions. She denies any recent vaginal bleeding. With normal gross fetal movement.      Objective: I have reviewed patient's vital signs, medications and labs. Blood pressure 114/77, pulse 98, temperature 97.8 F (36.6 C), temperature source Oral, resp. rate 20, height 5\' 4"  (1.626 m), weight 66.7 kg (147 lb 1.6 oz), last menstrual period 04/15/2015. General: alert, cooperative and no distress GI: normal findings: soft, non tender, gravid Extremities: edema in the feet bilaterally, 1+.   and no edema, redness or tenderness in the calves or thighs EFM: 150 BL, mod variabilty, reactive TOCO: Irregular Q 2 - 7 minutes CVX: deferred  . sodium chloride   Intravenous Once  . docusate sodium  100 mg Oral Daily  . NIFEdipine  10 mg Oral Q6H  . prenatal multivitamin  1 tablet Oral Q1200    Assessment/Plan: Posterior placenta previa @ 50 W 2 D EGA, second admission for vaginal bleeding, stable maternal and fetal status  -C/w close monitoring of fetus and mother.  -Procardia for contractions: May give 10 mg Q 20 mins up to 4 doses if BPs are normal  -  IV fluid bolus x 1  -For C/S at [redacted] weeks EGA, sooner if with significant vaginal bleeding.   -Type and screen Q 3 days, next check 02/09/16.   LOS: 5 days    [facility] 02/08/2016, 11:41 AM

## 2016-02-09 LAB — TYPE AND SCREEN
ABO/RH(D): O POS
ANTIBODY SCREEN: NEGATIVE

## 2016-02-09 NOTE — Progress Notes (Signed)
35 y.o. year old female,at [redacted]w[redacted]d gestation. The patient was admitted on 02/03/2016 after an episode of vaginal bleeding. She has a known history of placenta previa. This is her second bleeding episode. The patient has been monitored in the hospital. She has intermittent contractions that are controlled with Procardia and fluid boluses. She has received 2 doses of betamethasone. She has not had any bleeding for several days now. Her type and screen is updated every 3 days. Her most recent type and screen was today. The patient is scheduled for cesarean delivery on 02/13/2016.  SUBJECTIVE:  Doing well today. She has had only a few contractions. She denies bleeding. Bowel function is normal.  OBJECTIVE:  BP 112/79 (BP Location: Right Arm)   Pulse 98   Temp 97.8 F (36.6 C) (Oral)   Resp 18   Ht 5\' 4"  (1.626 m)   Wt 147 lb 1.6 oz (66.7 kg)   LMP 04/15/2015   BMI 25.25 kg/m   Fetal Heart Tones:  Category 1  Contractions:          Mild and irregular  Abdomen: Nontender Extremities: No masses or cords  ASSESSMENT:  [redacted]w[redacted]d Weeks Pregnancy  Placenta previa  Third trimester bleeding  PLAN:  The patient is scheduled for cesarean delivery on 02/13/2016.  If the patient experiences bleeding prior to that time, then cesarean delivery is appropriate.  Type and screen is updated every 3 days. The  Next type and screen is due on 02/12/2016.  Leonard Schwartz, M.D. 02/09/2016 6:15 PM

## 2016-02-10 NOTE — Progress Notes (Signed)
35 y.o. year old female,at [redacted]w[redacted]d gestation. The patient was admitted on 02/03/2016 after an episode of vaginal bleeding. She has a known history of placenta previa. This is her second bleeding episode. The patient has been monitored in the hospital. She has intermittent contractions that are controlled with Procardia and fluid boluses. She has received 2 doses of betamethasone. She has not had any bleeding for several days now. Her type and screen is updated every 3 days. Her most recent type and screen was today. The patient is scheduled for cesarean delivery on 02/13/2016.  SUBJECTIVE:  Pt doing well.  Feels contractions, 5/10 pain.  Denies vaginal bleeding or LOF.  Active fetus.  C/o itching at IV site.  OBJECTIVE:  BP 104/70 (BP Location: Right Arm)   Pulse 89   Temp 98.1 F (36.7 C) (Oral)   Resp 15   Ht 5\' 4"  (1.626 m)   Wt 66.7 kg (147 lb 1.6 oz)   LMP 04/15/2015   BMI 25.25 kg/m   Fetal Heart Tones:  Reactive, no decelerations, good variability  Contractions:          5-6 contractions per hour.  Abdomen: Nontender Extremities: No masses or cords IV site normal.  No s/sxs of infection or infiltration.  RN informed.  ASSESSMENT:  [redacted]w[redacted]d Weeks Pregnancy  Placenta previa  Third trimester bleeding  Stable.  PLAN:  Continue inpatient bedrest and plan of care.  The patient is scheduled for cesarean delivery on 02/13/2016.  If the patient experiences bleeding prior to that time, then cesarean delivery is appropriate.  Type and screen is updated every 3 days. The  Next type and screen is due on 02/12/2016.  Lucky Rathke, MD.

## 2016-02-11 NOTE — Progress Notes (Signed)
OB ANTE:  SUBJECTIVE:  Pt resting comfortably in bed.  Still having irregular contractions, that are better than before.  At the worse she will feel a contraction every 10-9720min, but not all the time.  Rates the pain the same about 5/10.  No vaginal bleeding, no LOF, +FM.  No acute complaints.  OBJECTIVE:  BP 107/73 (BP Location: Right Arm)   Pulse 88   Temp 97.9 F (36.6 C) (Oral)   Resp 15   Ht 5\' 4"  (1.626 m)   Wt 66.7 kg (147 lb 1.6 oz)   LMP 04/15/2015   BMI 25.25 kg/m   Gen: NAD CV: RRR Lungs:CTAB Abd: soft, non-tender, gravid GU: Deferred Extremities: Minimal non-pitting edema, no calf tenderness bilaterally  Fetal Heart Tones:  140, moderate variability, Reactive, no decelerations  Contractions:          5-6 contractions per hour.   ASSESSMENT:  3311w4d Weeks Pregnancy  Placenta previa  Third trimester bleeding  Stable.  PLAN:  Continue inpatient bedrest and plan of care.  Continue Procardia for preterm contractions, remain unchanged from baseline  The patient is scheduled for cesarean delivery on 02/13/2016.  If the patient experiences bleeding prior to that time, then cesarean delivery is appropriate.  Type and screen is updated every 3 days. The  Next type and screen is due on 02/12/2016.  Myna HidalgoJennifer Raeghan Demeter, DO [phone] (pager) [phone] (office)

## 2016-02-12 ENCOUNTER — Encounter (HOSPITAL_COMMUNITY): Admit: 2016-02-12 | Payer: BLUE CROSS/BLUE SHIELD

## 2016-02-12 LAB — PREPARE RBC (CROSSMATCH)

## 2016-02-12 MED ORDER — SODIUM CHLORIDE 0.9 % IV SOLN: Freq: Once | INTRAVENOUS | Status: DC

## 2016-02-12 NOTE — Progress Notes (Addendum)
Whitney Cole, Whitney Cole Female, 35 y.o., January 31, 1981  G1 P0 at 35 weeks 6 days EGA with posterior placenta previa, second admission for vaginal bleeding  Subjective: Patient reports no complaints.  She feels occasional abdominal cramping.   Denies any recent vaginal bleeding, has had scant dark brown discharge on peri pad.     Objective: I have reviewed patient's vital signs, medications and labs. Blood pressure 99/73, pulse 76, temperature 97.6 F (36.4 C), resp. rate 16, height 5\' 4"  (1.626 m), weight 66.7 kg (147 lb 1.6 oz), last menstrual period 04/15/2015.  General: alert, cooperative and no distress Resp: clear to auscultation bilaterally Cardio: regular rate and rhythm, S1, S2 normal, no murmur, click, rub or gallop GI: soft, non-tender; bowel sounds normal; no masses,  no organomegaly Extremities: extremities normal, atraumatic, no cyanosis or edema and no edema, redness or tenderness in the calves or thighs  EFM: 130 BL, mod variability, reactive.  TOCO: Irregular contractions, Q 5- 8 minutes.  CVX: deferred.   CBC    Component Value Date/Time   WBC 10.6 (H) 02/03/2016 0335   RBC 4.21 02/03/2016 0335   HGB 13.9 02/03/2016 0335   HCT 39.2 02/03/2016 0335   PLT 174 02/03/2016 0335   MCV 93.1 02/03/2016 0335   MCH 33.0 02/03/2016 0335   MCHC 35.5 02/03/2016 0335   RDW 13.4 02/03/2016 0335   Blood type:  O POS . sodium chloride   Intravenous Once  . docusate sodium  100 mg Oral Daily  . NIFEdipine  10 mg Oral Q6H  . prenatal multivitamin  1 tablet Oral Q1200    Assessment/Plan: Posterior placenta previa at 7935 W 6 D EGA, second admission for vaginal bleeding, Stable maternal and fetal status,  -status post betamethasone -Procardia for contractions -Blood type and screen Q 3 days: done today -For C/S delivery 02/13/2016 - NPO after midnight -discussed expectations of cesarean procedure, questions answered.  Pre op orders placed in. Blood bank notified to prepare 2 units  of blood for tomorrow.  -Unsure about circumcision of neonate.        LOS: 9 days    Whitney Cole 02/12/2016, 11:07 AM

## 2016-02-12 NOTE — Progress Notes (Signed)
Doing well, resting comfortably. "Ready" for C/S tomorrow, scheduled at 0915. NPO after MN. 2 u PRBCs prepared for surgery.  No bleeding. FHR Category 1 on TID NST. Occasional UCs.  Nigel BridgemanVicki Ilynn Stauffer, CNM 02/12/16 10:20p

## 2016-02-13 ENCOUNTER — Encounter (HOSPITAL_COMMUNITY): Payer: Self-pay

## 2016-02-13 ENCOUNTER — Inpatient Hospital Stay (HOSPITAL_COMMUNITY): Payer: BLUE CROSS/BLUE SHIELD | Admitting: Anesthesiology

## 2016-02-13 ENCOUNTER — Encounter (HOSPITAL_COMMUNITY)
Admission: AD | Disposition: A | Discharge status: Home / Self Care | Payer: Self-pay | Source: Ambulatory Visit | Attending: Obstetrics & Gynecology

## 2016-02-13 ENCOUNTER — Inpatient Hospital Stay (HOSPITAL_COMMUNITY)
Admission: RE | Admit: 2016-02-13 | Payer: BLUE CROSS/BLUE SHIELD | Source: Ambulatory Visit | Admitting: Obstetrics and Gynecology

## 2016-02-13 DIAGNOSIS — Z98891 History of uterine scar from previous surgery: Secondary | ICD-10-CM

## 2016-02-13 LAB — CBC
HCT: 37.2 % (ref 36.0–46.0)
Hemoglobin: 12.7 g/dL (ref 12.0–15.0)
MCH: 32.3 pg (ref 26.0–34.0)
MCHC: 34.1 g/dL (ref 30.0–36.0)
MCV: 94.7 fL (ref 78.0–100.0)
PLATELETS: 160 10*3/uL (ref 150–400)
RBC: 3.93 MIL/uL (ref 3.87–5.11)
RDW: 13.5 % (ref 11.5–15.5)
WBC: 8.4 10*3/uL (ref 4.0–10.5)

## 2016-02-13 SURGERY — Surgical Case
Anesthesia: Spinal

## 2016-02-13 MED ORDER — OXYCODONE-ACETAMINOPHEN 5-325 MG PO TABS
1.0000 | ORAL_TABLET | ORAL | Status: DC | PRN
  Administered 2016-02-13 – 2016-02-15 (×5): 1 via ORAL
  Filled 2016-02-13 (×6): qty 1

## 2016-02-13 MED ORDER — IBUPROFEN 600 MG PO TABS
600.0000 mg | ORAL_TABLET | Freq: Four times a day (QID) | ORAL | Status: DC
  Administered 2016-02-13 – 2016-02-15 (×8): 600 mg via ORAL
  Filled 2016-02-13 (×8): qty 1

## 2016-02-13 MED ORDER — ONDANSETRON HCL 4 MG/2ML IJ SOLN
INTRAMUSCULAR | Status: AC
  Filled 2016-02-13: qty 2

## 2016-02-13 MED ORDER — SODIUM CHLORIDE 0.9% FLUSH: 3.0000 mL | INTRAVENOUS | Status: DC | PRN

## 2016-02-13 MED ORDER — DIBUCAINE 1 % RE OINT: 1.0000 "application " | TOPICAL_OINTMENT | RECTAL | Status: DC | PRN

## 2016-02-13 MED ORDER — KETOROLAC TROMETHAMINE 30 MG/ML IJ SOLN: 30.0000 mg | Freq: Four times a day (QID) | INTRAMUSCULAR | Status: DC | PRN

## 2016-02-13 MED ORDER — ACETAMINOPHEN 325 MG PO TABS
650.0000 mg | ORAL_TABLET | ORAL | Status: DC | PRN
  Administered 2016-02-13: 650 mg via ORAL
  Filled 2016-02-13 (×2): qty 2

## 2016-02-13 MED ORDER — ONDANSETRON HCL 4 MG/2ML IJ SOLN
INTRAMUSCULAR | Status: DC | PRN
Start: 2016-02-13 — End: 2016-02-13
  Administered 2016-02-13: 4 mg via INTRAVENOUS

## 2016-02-13 MED ORDER — MORPHINE SULFATE-NACL 0.5-0.9 MG/ML-% IV SOSY
PREFILLED_SYRINGE | INTRAVENOUS | Status: AC
  Filled 2016-02-13: qty 1

## 2016-02-13 MED ORDER — SOD CITRATE-CITRIC ACID 500-334 MG/5ML PO SOLN
30.0000 mL | ORAL | Status: AC
  Administered 2016-02-13: 30 mL via ORAL
  Filled 2016-02-13: qty 15

## 2016-02-13 MED ORDER — BUPIVACAINE IN DEXTROSE 0.75-8.25 % IT SOLN
INTRATHECAL | Status: DC | PRN
  Administered 2016-02-13: 10.5 mg via INTRATHECAL

## 2016-02-13 MED ORDER — COCONUT OIL OIL: 1.0000 "application " | TOPICAL_OIL | Status: DC | PRN

## 2016-02-13 MED ORDER — FENTANYL CITRATE (PF) 100 MCG/2ML IJ SOLN: 25.0000 ug | INTRAMUSCULAR | Status: DC | PRN

## 2016-02-13 MED ORDER — OXYTOCIN 40 UNITS IN LACTATED RINGERS INFUSION - SIMPLE MED
2.5000 [IU]/h | INTRAVENOUS | Status: AC
  Administered 2016-02-13: 2.5 [IU]/h via INTRAVENOUS

## 2016-02-13 MED ORDER — LACTATED RINGERS IV SOLN
INTRAVENOUS | Status: DC
  Administered 2016-02-13: 125 mL/h via INTRAVENOUS

## 2016-02-13 MED ORDER — NALBUPHINE HCL 10 MG/ML IJ SOLN: 5.0000 mg | INTRAMUSCULAR | Status: DC | PRN

## 2016-02-13 MED ORDER — WITCH HAZEL-GLYCERIN EX PADS: 1.0000 "application " | MEDICATED_PAD | CUTANEOUS | Status: DC | PRN

## 2016-02-13 MED ORDER — PRENATAL MULTIVITAMIN CH
1.0000 | ORAL_TABLET | Freq: Every day | ORAL | Status: DC
  Administered 2016-02-14 – 2016-02-15 (×2): 1 via ORAL
  Filled 2016-02-13 (×2): qty 1

## 2016-02-13 MED ORDER — NALBUPHINE HCL 10 MG/ML IJ SOLN: 5.0000 mg | Freq: Once | INTRAMUSCULAR | Status: DC | PRN

## 2016-02-13 MED ORDER — SCOPOLAMINE 1 MG/3DAYS TD PT72
1.0000 | MEDICATED_PATCH | Freq: Once | TRANSDERMAL | Status: DC
  Administered 2016-02-13: 1.5 mg via TRANSDERMAL

## 2016-02-13 MED ORDER — PHENYLEPHRINE 8 MG IN D5W 100 ML (0.08MG/ML) PREMIX OPTIME
INJECTION | INTRAVENOUS | Status: AC
  Filled 2016-02-13: qty 100

## 2016-02-13 MED ORDER — SIMETHICONE 80 MG PO CHEW
80.0000 mg | CHEWABLE_TABLET | Freq: Three times a day (TID) | ORAL | Status: DC
  Administered 2016-02-13 – 2016-02-15 (×4): 80 mg via ORAL
  Filled 2016-02-13 (×5): qty 1

## 2016-02-13 MED ORDER — SODIUM BICARBONATE 8.4 % IV SOLN
INTRAVENOUS | Status: DC | PRN
  Administered 2016-02-13: 0 mL via EPIDURAL

## 2016-02-13 MED ORDER — OXYTOCIN 10 UNIT/ML IJ SOLN
INTRAVENOUS | Status: DC | PRN
  Administered 2016-02-13: 40 [IU] via INTRAVENOUS

## 2016-02-13 MED ORDER — SENNOSIDES-DOCUSATE SODIUM 8.6-50 MG PO TABS
2.0000 | ORAL_TABLET | ORAL | Status: DC
  Administered 2016-02-13 – 2016-02-14 (×2): 2 via ORAL
  Filled 2016-02-13 (×2): qty 2

## 2016-02-13 MED ORDER — ZOLPIDEM TARTRATE 5 MG PO TABS: 5.0000 mg | ORAL_TABLET | Freq: Every evening | ORAL | Status: DC | PRN

## 2016-02-13 MED ORDER — DIPHENHYDRAMINE HCL 25 MG PO CAPS
25.0000 mg | ORAL_CAPSULE | ORAL | Status: DC | PRN
  Filled 2016-02-13: qty 1

## 2016-02-13 MED ORDER — CEFAZOLIN SODIUM-DEXTROSE 2-4 GM/100ML-% IV SOLN
2.0000 g | INTRAVENOUS | Status: AC
  Administered 2016-02-13: 2 g via INTRAVENOUS
  Filled 2016-02-13 (×3): qty 100

## 2016-02-13 MED ORDER — PHENYLEPHRINE 8 MG IN D5W 100 ML (0.08MG/ML) PREMIX OPTIME
INJECTION | INTRAVENOUS | Status: DC | PRN
  Administered 2016-02-13: 60 ug/min via INTRAVENOUS

## 2016-02-13 MED ORDER — SIMETHICONE 80 MG PO CHEW: 80.0000 mg | CHEWABLE_TABLET | ORAL | Status: DC | PRN

## 2016-02-13 MED ORDER — DIPHENHYDRAMINE HCL 25 MG PO CAPS: 25.0000 mg | ORAL_CAPSULE | Freq: Four times a day (QID) | ORAL | Status: DC | PRN

## 2016-02-13 MED ORDER — TETANUS-DIPHTH-ACELL PERTUSSIS 5-2.5-18.5 LF-MCG/0.5 IM SUSP: 0.5000 mL | Freq: Once | INTRAMUSCULAR | Status: DC

## 2016-02-13 MED ORDER — SCOPOLAMINE 1 MG/3DAYS TD PT72
MEDICATED_PATCH | TRANSDERMAL | Status: AC
Start: 2016-02-13 — End: 2016-02-13
  Filled 2016-02-13: qty 1

## 2016-02-13 MED ORDER — LACTATED RINGERS IV SOLN: INTRAVENOUS | Status: DC

## 2016-02-13 MED ORDER — KETOROLAC TROMETHAMINE 30 MG/ML IJ SOLN
30.0000 mg | Freq: Four times a day (QID) | INTRAMUSCULAR | Status: DC | PRN
  Administered 2016-02-13: 30 mg via INTRAMUSCULAR

## 2016-02-13 MED ORDER — SODIUM CHLORIDE 0.9 % IR SOLN
Status: DC | PRN
  Administered 2016-02-13: 1000 mL

## 2016-02-13 MED ORDER — LACTATED RINGERS IV SOLN
INTRAVENOUS | Status: DC | PRN
  Administered 2016-02-13 (×3): via INTRAVENOUS

## 2016-02-13 MED ORDER — NALOXONE HCL 2 MG/2ML IJ SOSY
1.0000 ug/kg/h | PREFILLED_SYRINGE | INTRAMUSCULAR | Status: DC | PRN
  Filled 2016-02-13: qty 2

## 2016-02-13 MED ORDER — FENTANYL CITRATE (PF) 100 MCG/2ML IJ SOLN
INTRAMUSCULAR | Status: AC
  Filled 2016-02-13: qty 2

## 2016-02-13 MED ORDER — DIPHENHYDRAMINE HCL 50 MG/ML IJ SOLN: 12.5000 mg | INTRAMUSCULAR | Status: DC | PRN

## 2016-02-13 MED ORDER — OXYCODONE-ACETAMINOPHEN 5-325 MG PO TABS: 2.0000 | ORAL_TABLET | ORAL | Status: DC | PRN

## 2016-02-13 MED ORDER — FENTANYL CITRATE (PF) 100 MCG/2ML IJ SOLN
INTRAMUSCULAR | Status: DC | PRN
  Administered 2016-02-13: 10 ug via INTRATHECAL

## 2016-02-13 MED ORDER — MORPHINE SULFATE (PF) 0.5 MG/ML IJ SOLN
INTRAMUSCULAR | Status: DC | PRN
  Administered 2016-02-13: .2 mg via INTRATHECAL

## 2016-02-13 MED ORDER — NALBUPHINE HCL 10 MG/ML IJ SOLN
5.0000 mg | Freq: Once | INTRAMUSCULAR | Status: DC | PRN
Start: 2016-02-13 — End: 2016-02-13

## 2016-02-13 MED ORDER — KETOROLAC TROMETHAMINE 30 MG/ML IJ SOLN
INTRAMUSCULAR | Status: AC
  Filled 2016-02-13: qty 1

## 2016-02-13 MED ORDER — NALOXONE HCL 0.4 MG/ML IJ SOLN: 0.4000 mg | INTRAMUSCULAR | Status: DC | PRN

## 2016-02-13 MED ORDER — ONDANSETRON HCL 4 MG/2ML IJ SOLN
4.0000 mg | Freq: Three times a day (TID) | INTRAMUSCULAR | Status: DC | PRN
Start: 2016-02-13 — End: 2016-02-13

## 2016-02-13 MED ORDER — MENTHOL 3 MG MT LOZG: 1.0000 | LOZENGE | OROMUCOSAL | Status: DC | PRN

## 2016-02-13 MED ORDER — SIMETHICONE 80 MG PO CHEW
80.0000 mg | CHEWABLE_TABLET | ORAL | Status: DC
  Administered 2016-02-13 – 2016-02-14 (×2): 80 mg via ORAL
  Filled 2016-02-13 (×2): qty 1

## 2016-02-13 MED ORDER — MEPERIDINE HCL 25 MG/ML IJ SOLN: 6.2500 mg | INTRAMUSCULAR | Status: DC | PRN

## 2016-02-13 MED ORDER — OXYTOCIN 10 UNIT/ML IJ SOLN
INTRAMUSCULAR | Status: AC
  Filled 2016-02-13: qty 1

## 2016-02-13 SURGICAL SUPPLY — 38 items
BENZOIN TINCTURE PRP APPL 2/3 (GAUZE/BANDAGES/DRESSINGS) ×3 IMPLANT
CHLORAPREP W/TINT 26ML (MISCELLANEOUS) ×3 IMPLANT
CLAMP CORD UMBIL (MISCELLANEOUS) IMPLANT
CLOSURE STERI STRIP 1/2 X4 (GAUZE/BANDAGES/DRESSINGS) ×2 IMPLANT
CLOSURE WOUND 1/2 X4 (GAUZE/BANDAGES/DRESSINGS) ×1
CLOTH BEACON ORANGE TIMEOUT ST (SAFETY) ×3 IMPLANT
CONTAINER PREFILL 10% NBF 15ML (MISCELLANEOUS) IMPLANT
DRAIN JACKSON PRT FLT 10 (DRAIN) IMPLANT
DRSG OPSITE POSTOP 4X10 (GAUZE/BANDAGES/DRESSINGS) ×3 IMPLANT
ELECT REM PT RETURN 9FT ADLT (ELECTROSURGICAL) ×3
ELECTRODE REM PT RTRN 9FT ADLT (ELECTROSURGICAL) ×1 IMPLANT
EVACUATOR SILICONE 100CC (DRAIN) IMPLANT
EXTRACTOR VACUUM M CUP 4 TUBE (SUCTIONS) IMPLANT
EXTRACTOR VACUUM M CUP 4' TUBE (SUCTIONS)
GLOVE BIO SURGEON STRL SZ 6.5 (GLOVE) ×2 IMPLANT
GLOVE BIO SURGEONS STRL SZ 6.5 (GLOVE) ×1
GLOVE BIOGEL PI IND STRL 7.0 (GLOVE) ×2 IMPLANT
GLOVE BIOGEL PI INDICATOR 7.0 (GLOVE) ×4
GOWN STRL REUS W/TWL LRG LVL3 (GOWN DISPOSABLE) ×6 IMPLANT
KIT ABG SYR 3ML LUER SLIP (SYRINGE) IMPLANT
NEEDLE HYPO 25X5/8 SAFETYGLIDE (NEEDLE) ×6 IMPLANT
NS IRRIG 1000ML POUR BTL (IV SOLUTION) ×3 IMPLANT
PACK C SECTION WH (CUSTOM PROCEDURE TRAY) ×3 IMPLANT
PAD OB MATERNITY 4.3X12.25 (PERSONAL CARE ITEMS) ×3 IMPLANT
PENCIL SMOKE EVAC W/HOLSTER (ELECTROSURGICAL) ×3 IMPLANT
RTRCTR C-SECT PINK 25CM LRG (MISCELLANEOUS) IMPLANT
STRIP CLOSURE SKIN 1/2X4 (GAUZE/BANDAGES/DRESSINGS) ×2 IMPLANT
SUT CHROMIC 0 CT 1 (SUTURE) ×3 IMPLANT
SUT MNCRL AB 3-0 PS2 27 (SUTURE) ×3 IMPLANT
SUT PLAIN 2 0 (SUTURE) ×4
SUT PLAIN 2 0 XLH (SUTURE) ×3 IMPLANT
SUT PLAIN ABS 2-0 CT1 27XMFL (SUTURE) ×2 IMPLANT
SUT SILK 2 0 SH (SUTURE) IMPLANT
SUT VIC AB 0 CTX 36 (SUTURE) ×8
SUT VIC AB 0 CTX36XBRD ANBCTRL (SUTURE) ×4 IMPLANT
SUT VIC AB 4-0 KS 27 (SUTURE) ×3 IMPLANT
TOWEL OR 17X24 6PK STRL BLUE (TOWEL DISPOSABLE) ×6 IMPLANT
TRAY FOLEY CATH SILVER 14FR (SET/KITS/TRAYS/PACK) ×3 IMPLANT

## 2016-02-13 NOTE — Lactation Note (Signed)
This note was copied from a baby's chart. Lactation Consultation Note  Baby 5 hours old.  P1.  Baby 36 weeks.  LPI protocol reviewed. Baby bobbing head and attempting to latch.  Assisted mother off and on with depth. Mother has large nipples and baby needed help getting on deep enough. During consult baby maintained his position off and on latching.  Very few swallows observed but baby continued to try. Set up DEBP.  Reviewed cleaning and milk storage.    Pumped for 10 min on L side while baby suckled on R side.  Mother tired - may need review. Suggest she post pump 4-6 times a day for 10-15 min. Spoon feed him volume pumped. Mom encouraged to feed baby 8-12 times/24 hours and with feeding cues at least every 3 hours. Mom made aware of O/P services, breastfeeding support groups, community resources, and our phone # for post-discharge questions.  Placed hat on baby.  Demonstrated hand expression. Discussed plan with Nadeen LandauMichelle RN.   Patient Name: Whitney Cole ZOXWR'UToday's Date: 02/13/2016 Reason for consult: Late preterm infant;Initial assessment   Maternal Data    Feeding Feeding Type: Breast Fed Length of feed: 15 min (off and on)  LATCH Score/Interventions Latch: Repeated attempts needed to sustain latch, nipple held in mouth throughout feeding, stimulation needed to elicit sucking reflex. Intervention(s): Skin to skin Intervention(s): Adjust position;Assist with latch  Audible Swallowing: None Intervention(s): Hand expression;Skin to skin Intervention(s): Hand expression;Skin to skin  Type of Nipple: Everted at rest and after stimulation  Comfort (Breast/Nipple): Soft / non-tender     Hold (Positioning): Assistance needed to correctly position infant at breast and maintain latch.  LATCH Score: 6  Lactation Tools Discussed/Used Pump Review: Setup, frequency, and cleaning;Milk Storage Initiated by:: Dahlia Byesuth Berkelhammer RN Date initiated:: 02/14/16   Consult  Status Consult Status: Follow-up Date: 02/14/16 Follow-up type: In-patient    Dahlia ByesBerkelhammer, [facility] 02/13/2016, 4:14 PM

## 2016-02-13 NOTE — Progress Notes (Signed)
Pt to OR via stretcher for scheduled Cesarean Section. 

## 2016-02-13 NOTE — Lactation Note (Signed)
This note was copied from a baby's chart. Lactation Consultation Note  Patient Name: Whitney Cole ZOXWR'UToday's Date: 02/13/2016 Reason for consult: Follow-up assessment Baby at 12 hr of life. RN called for lactation to help mom with manual expression and spoon feeding because baby has been to sleepy to bf. Tried to wake baby. Mom was tried using the DEBP but was not having much success. She was able to manually express about 4 ml of colostrum. Demonstrated to FOB how to spoon and syring feed baby when mom was done expression. Left Alimentum in case mom is not able to express 5-497ml to offer baby after bf. Parents desire to ebf at this time but stated if mom is not able to express milk per LPT infant guidelines they will use the formula. They are aware of lactation services. They will call as needed.    Maternal Data Has patient been taught Hand Expression?: Yes Does the patient have breastfeeding experience prior to this delivery?: No  Feeding Feeding Type: Breast Fed Length of feed: 0 min  LATCH Score/Interventions Latch: Too sleepy or reluctant, no latch achieved, no sucking elicited. Intervention(s): Skin to skin;Waking techniques Intervention(s): Adjust position;Assist with latch;Breast compression  Audible Swallowing: None Intervention(s): Skin to skin;Hand expression Intervention(s): Alternate breast massage  Type of Nipple: Everted at rest and after stimulation  Comfort (Breast/Nipple): Soft / non-tender     Hold (Positioning): Full assist, staff holds infant at breast Intervention(s): Support Pillows;Position options  LATCH Score: 4  Lactation Tools Discussed/Used     Consult Status Consult Status: Follow-up Date: 02/14/16 Follow-up type: In-patient    Rulon Eisenmengerlizabeth E Tennyson Kallen 02/13/2016, 10:51 PM

## 2016-02-13 NOTE — Anesthesia Postprocedure Evaluation (Signed)
Anesthesia Post Note  Patient: Whitney Cole  Procedure(s) Performed: Procedure(s) (LRB): PRIMARY CESAREAN SECTION (N/A)  Patient location during evaluation: Mother Baby Anesthesia Type: Spinal Level of consciousness: awake Pain management: satisfactory to patient Vital Signs Assessment: post-procedure vital signs reviewed and stable Respiratory status: spontaneous breathing Cardiovascular status: stable Anesthetic complications: no     Last Vitals:  Vitals:   02/13/16 1300 02/13/16 1404  BP: 100/70 93/63  Pulse: 79 80  Resp: 18 18  Temp: 36.9 C 37.3 C    Last Pain:  Vitals:   02/13/16 1404  TempSrc: Oral  PainSc: 3    Pain Goal: Patients Stated Pain Goal: 2 (02/12/16 1935)               Cephus ShellingBURGER,[name]

## 2016-02-13 NOTE — Anesthesia Preprocedure Evaluation (Addendum)
Anesthesia Evaluation  Patient identified by MRN, date of birth, ID band Patient awake    Reviewed: Allergy & Precautions, NPO status , Patient's Chart, lab work & pertinent test results  Airway Mallampati: II  TM Distance: >3 FB Neck ROM: Full    Dental no notable dental hx.    Pulmonary neg pulmonary ROS,    Pulmonary exam normal breath sounds clear to auscultation       Cardiovascular negative cardio ROS Normal cardiovascular exam Rhythm:Regular Rate:Normal     Neuro/Psych negative neurological ROS  negative psych ROS   GI/Hepatic negative GI ROS, (+) Hepatitis -, B  Endo/Other  negative endocrine ROS  Renal/GU negative Renal ROS  negative genitourinary   Musculoskeletal negative musculoskeletal ROS (+)   Abdominal   Peds negative pediatric ROS (+)  Hematology negative hematology ROS (+)   Anesthesia Other Findings   Reproductive/Obstetrics (+) Pregnancy                             Anesthesia Physical Anesthesia Plan  ASA: II  Anesthesia Plan: Spinal   Post-op Pain Management:    Induction:   Airway Management Planned: Natural Airway  Additional Equipment:   Intra-op Plan:   Post-operative Plan:   Informed Consent: I have reviewed the patients History and Physical, chart, labs and discussed the procedure including the risks, benefits and alternatives for the proposed anesthesia with the patient or authorized representative who has indicated his/her understanding and acceptance.   Dental advisory given  Plan Discussed with: CRNA  Anesthesia Plan Comments:         Anesthesia Quick Evaluation

## 2016-02-13 NOTE — Op Note (Signed)
Cesarean Section Procedure Note   Whitney Cole  02/03/2016 - 02/13/2016  Indications: Breech Presentation and placenta previa   Pre-operative Diagnosis: Placenta Previa without Hemorrhage.   Post-operative Diagnosis: Same   Surgeon: Surgeon(s) and Role:    * Jaymes GraffNaima Calia Napp, MD - Primary    * Jaymes GraffNaima Ercie Eliasen, MD   Assistants:FNA   Anesthesia: spinal   Procedure Details:  The patient was seen in the Holding Room. The risks, benefits, complications, treatment options, and expected outcomes were discussed with the patient. The patient concurred with the proposed plan, giving informed consent. identified as Whitney Cole and the procedure verified as C-Section Delivery. A Time Out was held and the above information confirmed.  After induction of anesthesia, the patient was draped and prepped in the usual sterile manner. A transverse incision was made and carried down through the subcutaneous tissue to the fascia. Fascial incision was made in the midline and extended transversely. The fascia was separated from the underlying rectus muscle superiorly and inferiorly. The peritoneum was identified and entered. Peritoneal incision was extended longitudinally with good visualization of bowel and bladder. The utero-vesical peritoneal reflection was incised transversely and the bladder flap was bluntly freed from the lower uterine segment.  An alexsis retractor was placed in the abdomen.   A low transverse uterine incision was made. Delivered from breech footling presentation was a  infant, with Apgar scores of 1 at one minute and 9 at five minutes. Cord ph was sent the umbilical cord was clamped and cut cord blood was obtained for evaluation. The placenta was removed Intact and appeared normal. The uterine outline, tubes and ovaries appeared normal}. The uterine incision was closed with running locked sutures of 0Vicryl. A second layer 0 vicrlyl was used to imbricate the uterine incision    Hemostasis was  observed. Lavage was carried out until clear. The alexsis was removed.  The peritoneum was closed with 0 chromic.  The muscles were examined and any bleeders were made hemostatic using bovie cautery device.   The fascia was then reapproximated with running sutures of 0 vicryl.  . The subcuticular closure was performed using 4-0 vicryl  Instrument, sponge, and needle counts were correct prior the abdominal closure and were correct at the conclusion of the case.    Findings: infant was delivered from footling breech  presentation. The fluid was clear.  The uterus tubes and ovaries appeared normal.     Estimated Blood Loss: 650cc   Total IV Fluids: 2500ml   Urine Output: 250CC OF clear urine  Specimens: placenta to path   Complications: no complications  Disposition: PACU - hemodynamically stable.   Maternal Condition: stable   Baby condition / location:  Couplet care / Skin to Skin  Attending Attestation: I performed the procedure.   Signed: Surgeon(s): Jaymes GraffNaima Stormy Sabol, MD Jaymes GraffNaima Lary Eckardt, MD

## 2016-02-13 NOTE — Transfer of Care (Signed)
Immediate Anesthesia Transfer of Care Note  Patient: Whitney Cole  Procedure(s) Performed: Procedure(s): PRIMARY CESAREAN SECTION (N/A)  Patient Location: PACU  Anesthesia Type:Spinal  Level of Consciousness: awake  Airway & Oxygen Therapy: Patient Spontanous Breathing  Post-op Assessment: Report given to RN  Post vital signs: Reviewed and stable  Last Vitals:  Vitals:   02/13/16 0620 02/13/16 0800  BP: 102/70 103/75  Pulse: 70 96  Resp: 18 20  Temp: 36.4 C 36.9 C    Last Pain:  Vitals:   02/13/16 0800  TempSrc: Oral  PainSc: 0-No pain      Patients Stated Pain Goal: 2 (02/12/16 1935)  Complications: No apparent anesthesia complications

## 2016-02-13 NOTE — Anesthesia Procedure Notes (Signed)
Spinal  Patient location during procedure: OR Staffing Anesthesiologist: Jeralyn Nolden Performed: anesthesiologist  Preanesthetic Checklist Completed: patient identified, site marked, surgical consent, pre-op evaluation, timeout performed, IV checked, risks and benefits discussed and monitors and equipment checked Spinal Block Patient position: sitting Prep: ChloraPrep Patient monitoring: heart rate, continuous pulse ox and blood pressure Approach: right paramedian Location: L3-4 Injection technique: single-shot Needle Needle type: Sprotte  Needle gauge: 24 G Needle length: 9 cm Additional Notes Expiration date of kit checked and confirmed. Patient tolerated procedure well, without complications.       

## 2016-02-14 LAB — CBC
HEMATOCRIT: 29.4 % — AB (ref 36.0–46.0)
HEMOGLOBIN: 10.6 g/dL — AB (ref 12.0–15.0)
MCH: 34 pg (ref 26.0–34.0)
MCHC: 36.1 g/dL — ABNORMAL HIGH (ref 30.0–36.0)
MCV: 94.2 fL (ref 78.0–100.0)
Platelets: 144 10*3/uL — ABNORMAL LOW (ref 150–400)
RBC: 3.12 MIL/uL — AB (ref 3.87–5.11)
RDW: 13.7 % (ref 11.5–15.5)
WBC: 10.2 10*3/uL (ref 4.0–10.5)

## 2016-02-14 NOTE — Discharge Summary (Signed)
[facility] Discharge Summary   Patient Name:   Whitney Cole DOB:     1980/11/03 MRN:     [id number]  Date of Admission:   02/03/2016 Date of Discharge:  02/15/2016  Admitting diagnosis:    34 WEEKS BLEEDING Placenta Previa without Hemorrhage Principal Problem:   Status post primary low transverse cesarean section--previa Active Problems:   Antepartum hemorrhage from placenta previa   AMA (advanced maternal age) multigravida 35+--normal genetic screening      Discharge diagnosis:    17 WEEKS BLEEDING Placenta Previa without Hemorrhage Principal Problem:   Status post primary low transverse cesarean section--previa Active Problems:   Antepartum hemorrhage from placenta previa   AMA (advanced maternal age) multigravida 35+--normal genetic screening                                                                  Post partum procedures: NA  Type of Delivery:  Primary LTCS for previa  Delivering Provider: Jaymes Graff   Date of Delivery:  02/13/16  Newborn Data:    Live born female  Birth Weight: 6 lb 6.8 oz (2915 g) APGAR: 1, 9  Baby's Name:    Baby Feeding:   Breast Disposition:   home with mother  Complications:   None  Hospital course:      Sceduled C/S   35 y.o. yo G1P0100 at [redacted]w[redacted]d was admitted to the hospital 02/03/2016 for scheduled cesarean section with the following indication:Previa.   She was admitted on 7/29 with her 2nd bleeding episode.  She had received betamethasone course 7/20 and 7/21 during previous hospitalization.  From day of hospitalization, she was maintained in the hospital until scheduled C/S at 36 weeks, with resolution of the bleeding within 24 hours of that admission.  Membrane Rupture Time/Date: 10:12 AM ,02/13/2016   Patient delivered a Viable infant.02/13/2016   Details of operation can be found in separate operative note.  Pateint had an uncomplicated postpartum course.  She is ambulating, tolerating a regular diet,  passing flatus, and urinating well. Patient is discharged home in stable condition on  02/15/16.  She desired d/c on day 2.          Physical Exam:   Vitals:   02/14/16 0554 02/14/16 0626 02/14/16 0907 02/14/16 1745  BP: (!) 96/61  Pulse: 66 84 69 87  Resp: Temp: 97.6 F (36.4 C) 98.1 F (36.7 C) 97.5 F (36.4 C) 98.5 F (36.9 C)  TempSrc: Oral Oral Axillary Oral  SpO2:      Weight:      Height:       General: alert Lochia: appropriate Uterine Fundus: firm Incision: Dressing is clean, dry, and intact DVT Evaluation: No evidence of DVT seen on physical exam. Negative Homan's sign.  Labs: CBC Latest Ref Rng & Units 02/14/2016 02/13/2016 02/03/2016  WBC 4.0 - 10.5 K/uL 10.2 8.4 10.6(H)  Hemoglobin 12.0 - 15.0 g/dL 10.6(L) 12.7 13.9  Hematocrit 36.0 - 46.0 % 29.4(L) 37.2 39.2  Platelets 150 - 400 K/uL 144(L) 160 174   CMP Latest Ref Rng & Units 08/17/2015  Glucose 65 - 99 mg/dL 409(W)  BUN 7 - 25 mg/dL 9  Creatinine 1.19 - 1.47 mg/dL 8.29  Sodium 135 - 146 mmol/L 137  Potassium 3.5 - 5.3 mmol/L 3.9  Chloride 98 - 110 mmol/L 106  CO2 20 - 31 mmol/L 19(L)  Calcium 8.6 - 10.2 mg/dL 8.6  Total Protein 6.1 - 8.1 g/dL 6.2  Total Bilirubin 0.2 - 1.2 mg/dL 0.3  Alkaline Phos 33 - 115 U/L 33  AST 10 - 30 U/L 15  ALT 6 - 29 U/L 14    Discharge instruction: per After Visit Summary and "Baby and Me Booklet".  After Visit Meds:    Medication List    STOP taking these medications   NIFEdipine 10 MG capsule Commonly known as:  PROCARDIA     TAKE these medications   cholecalciferol 1000 units tablet Commonly known as:  VITAMIN D Take 4,000 Units by mouth daily.   ibuprofen 600 MG tablet Commonly known as:  ADVIL,MOTRIN Take 1 tablet (600 mg total) by mouth every 6 (six) hours as needed.   multivitamin-prenatal 27-0.8 MG Tabs tablet Take 1 tablet by mouth daily.   oxyCODONE-acetaminophen 5-325 MG tablet Commonly known as:   PERCOCET/ROXICET Take 1 tablet by mouth every 4 (four) hours as needed (pain scale 4-7).       Diet: routine diet  Activity: Advance as tolerated. Pelvic rest for 6 weeks.   Outpatient follow up:6 weeks Follow up Appt:No future appointments. Follow up visit: No Follow-up on file.  Postpartum contraception: None at present--hx infertility  02/15/2016 Nigel BridgemanLATHAM, Syniyah Bourne, CNM

## 2016-02-14 NOTE — Lactation Note (Signed)
This note was copied from a baby's chart. Lactation Consultation Note Follow up visit at 31 hours of age.  Baby was 6771w0d at delivery.  Mom is supplementing with few breastfeedings. LC reviewed feeding plan to wake baby every  2 1/2-3 hours and feed with early feeding cues on demand.  Mom to breast feed with supplement per guidelines and finish feeding within about 30 minutes.  Mom to post pump with DEBP and then hand express to supplement next feeding.  LC encouraged mom to massage breasts during feeding and keep baby active.  Mom denies pain with latch.  Few swallows audible, working with mom on positioning.  FOB at bedside supportive.  RN at bedside and reviewing feeding plan.  Mom to call for assist as needed.   Patient Name: Whitney Cole Reason for consult: Follow-up assessment;Late preterm infant   Maternal Data    Feeding Feeding Type: Breast Fed Length of feed:  (observed 15 minutes)  LATCH Score/Interventions Latch: Repeated attempts needed to sustain latch, nipple held in mouth throughout feeding, stimulation needed to elicit sucking reflex. Intervention(s): Skin to skin;Teach feeding cues;Waking techniques Intervention(s): Breast massage;Breast compression  Audible Swallowing: A few with stimulation Intervention(s): Skin to skin;Hand expression  Type of Nipple: Everted at rest and after stimulation  Comfort (Breast/Nipple): Soft / non-tender     Hold (Positioning): No assistance needed to correctly position infant at breast. Intervention(s): Breastfeeding basics reviewed;Skin to skin  LATCH Score: 8  Lactation Tools Discussed/Used     Consult Status Consult Status: Follow-up Date: 02/15/16 Follow-up type: In-patient    Whitney Cole, Whitney Cole Cole, 5:48 PM

## 2016-02-14 NOTE — Progress Notes (Signed)
In to see patient--doing well, up ad lib. Dressing changed by RN due to soiling--no active bleeding noted, new dressing CDI.  Vitals:   02/14/16 0554 02/14/16 0626 02/14/16 0907 02/14/16 1745  BP: (!) 88/50 92/68 94/60  96/61  Pulse: 66 84 69 87  Resp: 16 18 18 18   Temp: 97.6 F (36.4 C) 98.1 F (36.7 C) 97.5 F (36.4 C) 98.5 F (36.9 C)  TempSrc: Oral Oral Axillary Oral  SpO2:      Weight:      Height:        Results for orders placed or performed during the hospital encounter of 02/03/16 (from the past 24 hour(s))  CBC     Status: Abnormal   Collection Time: 02/14/16  6:30 AM  Result Value Ref Range   WBC 10.2 4.0 - 10.5 K/uL   RBC 3.12 (L) 3.87 - 5.11 MIL/uL   Hemoglobin 10.6 (L) 12.0 - 15.0 g/dL   HCT 16.129.4 (L) 09.636.0 - 04.546.0 %   MCV 94.2 78.0 - 100.0 fL   MCH 34.0 26.0 - 34.0 pg   MCHC 36.1 (H) 30.0 - 36.0 g/dL   RDW 40.913.7 81.111.5 - 91.415.5 %   Platelets 144 (L) 150 - 400 K/uL    Patient interested in going home tomorrow. Declines contraception at present--hx infertility.  Will see in the am and determine plan for d/c.  Whitney BridgemanVicki Osten Janek, CNM 02/14/16 7:15p

## 2016-02-14 NOTE — Discharge Instructions (Signed)

## 2016-02-14 NOTE — Progress Notes (Addendum)
Whitney Cole [id number]  Subjective: Postpartum Day 1: Primary LTC/S due to previa Patient has stood at bedside, foley still in place. Feeding:  Breast Contraceptive plan:  Undecided  Objective: Temp:  [97 F (36.1 C)-99.1 F (37.3 C)] 97 F (36.1 C) (08/09 0200) Pulse Rate:  [51-96] 61 (08/09 0200) Resp:  [14-23] 16 (08/09 0200) BP: (78-150)/(50-116) 84/55 (08/09 0200) SpO2:  [91 %-100 %] 96 % (08/08 2152)  CBC Latest Ref Rng & Units 02/13/2016 02/03/2016 01/26/2016  WBC 4.0 - 10.5 K/uL 8.4 10.6(H) 9.6  Hemoglobin 12.0 - 15.0 g/dL 19.112.7 47.813.9 29.512.4  Hematocrit 36.0 - 46.0 % 37.2 39.2 35.9(L)  Platelets 150 - 400 K/uL 160 174 148(L)     Physical Exam:  General: alert and cooperative Lochia: appropriate Uterine Fundus: firm Abdomen:  + bowel sounds Incision: Pressure dressing CDI DVT Evaluation: No evidence of DVT seen on physical exam. Negative Homan's sign, SCDs in place Foley draining clear urine   Assessment/Plan: Status post cesarean delivery, day 1--previa Stable Continue current care. CBC pending this am. Progress activity today, d/c foley when ambulatory. Family declines circumcision.   Whitney BridgemanLATHAM, Whitney Kepner MSN, CNM 02/14/2016, 4:33 AM

## 2016-02-14 NOTE — Plan of Care (Signed)
Problem: Skin Integrity: Goal: Demonstration of wound healing without infection will improve At 1012, Dr. Normand Sloopillard was informed that the patient's abdominal dressing was seeping scant amounts of sero-sanquinous drainage from the distal portion of the dressing. Upon Dr. Redmond Basemanillard's permission, I subsequently removed both the pressure dressing and the honeycomb dressing. The honeycomb dressing was heavily saturated with sero-sanquinous drainage. The the steri-strips were moist with sero-sanquinous drainage. Scant sero-sanquinous drainage was noted seeping out of an approximately 1 cm area of the right side of the incision. The incision was approximated. Under sterile technique, a new honeycomb dressing was applied and covered with one sterile abdominal pad. Plan to continue to observe for further drainage.

## 2016-02-15 ENCOUNTER — Encounter (HOSPITAL_COMMUNITY): Payer: Self-pay | Admitting: *Deleted

## 2016-02-15 LAB — RPR: RPR: NONREACTIVE

## 2016-02-15 MED ORDER — OXYCODONE-ACETAMINOPHEN 5-325 MG PO TABS: 1.0000 | ORAL_TABLET | ORAL | 0 refills | Status: AC | PRN

## 2016-02-15 MED ORDER — IBUPROFEN 600 MG PO TABS: 600.0000 mg | ORAL_TABLET | Freq: Four times a day (QID) | ORAL | 2 refills | Status: AC | PRN

## 2016-02-15 NOTE — Lactation Note (Addendum)
This note was copied from a baby's chart. Lactation Consultation Note  Patient Name: Boy Pei-Cheng Cathie HoopsYu ZOXWR'UToday's Date: 02/15/2016 Reason for consult: Follow-up assessment;Late preterm infant  14Baby 49 hours old. Parents given 2-week DEBP rental.  Maternal Data    Feeding    LATCH Score/Interventions                      Lactation Tools Discussed/Used     Consult Status Consult Status: PRN    Sherlyn HayJennifer D Ming Mcmannis 02/15/2016, 11:35 AM

## 2016-02-15 NOTE — Lactation Note (Addendum)
This note was copied from a baby's chart. Lactation Consultation Note  Patient Name: Boy Pei-Cheng Cathie HoopsYu ZOXWR'UToday's Date: 02/15/2016 Reason for consult: Follow-up assessment;Late preterm infant  Baby 8348 hours old. Parents report that baby has been sleepy at the breast. Offered to assist with a latch, but parents report the baby is latching well, just becoming tired after a short time of nursing. Discussed LPI behavior, and enc increasing supplementation amounts. Reviewed supplementation guidelines and LPI care guidelines as well. Mom states that she has not been pumping regularly because she is not getting anything. Discussed supply and demand and the importance of regular stimulation of the breasts, especially in the first two weeks, to breast milk supply. Discussed benefits of hospital-grade DEBP as well.   Enc parents to put baby to breast first with cues and at least every 3 hours--offering lots of STS. Enc supplementing baby with EBM/formula according to guidelines. Enc mom to post-pump followed by hand expression. Discussed importance of limiting total feeding time to 30 minutes. Discussed engorgement prevention/treatment, and referred parents to Baby and Me booklet for EBM storage guidelines and number of diapers to expect by day of life. Parents aware of OP/BFSG and LC phone line assistance after D/C.  Parents given DEBP paperwork and have LC's phone extension to call when ready for the pump. Maternal Data    Feeding    LATCH Score/Interventions                      Lactation Tools Discussed/Used     Consult Status Consult Status: PRN    Sherlyn HayJennifer D Conal Shetley 02/15/2016, 11:07 AM

## 2016-02-16 LAB — TYPE AND SCREEN
ABO/RH(D): O POS
ANTIBODY SCREEN: NEGATIVE
UNIT DIVISION: 0
UNIT DIVISION: 0

## 2017-02-27 IMAGING — RF DG HYSTEROGRAM
9 series · 15 of 18 positions shown · IV contrast (omnipaque)
Comparison: None.

FLUOROSCOPY TIME:  Fluoroscopy Time:  60 seconds

Number of Acquired Images:  9

CLINICAL DATA: Infertility

EXAM:
HYSTEROSALPINGOGRAM
TECHNIQUE: Following cleansing of the cervix and vagina with Betadine solution,
a hysterosalpingogram was performed using a 5-French
hysterosalpingogram catheter and Omnipaque 300 contrast. The patient
tolerated the examination without difficulty.

[Series 1: run · 2 of 2 slices shown (1 of 9)]
[im 1/2]
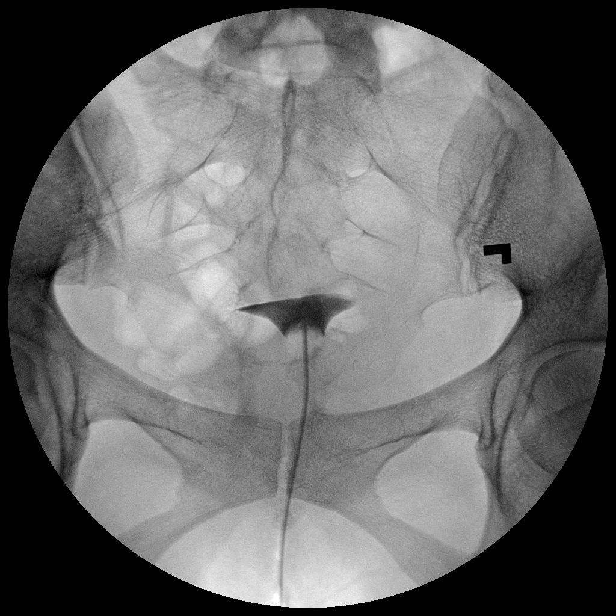
[im 2/2]
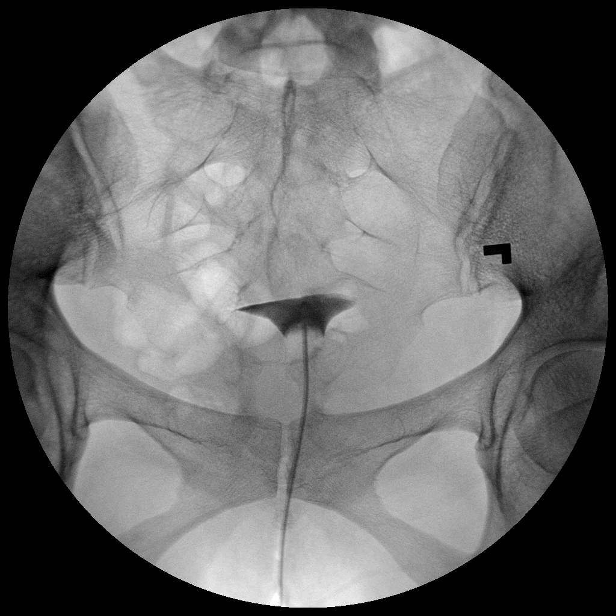

[Series 2: run · 1 of 2 slices shown (2 of 9)]
[im 2/2]
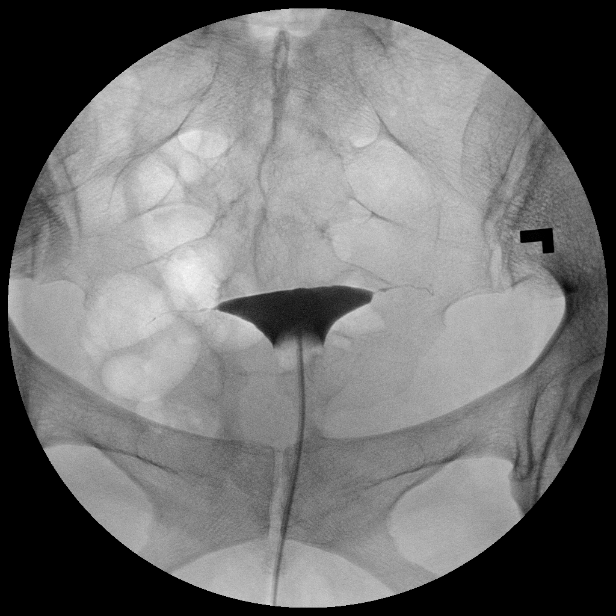

[Series 3: run · 2 of 2 slices shown (3 of 9)]
[im 1/2]
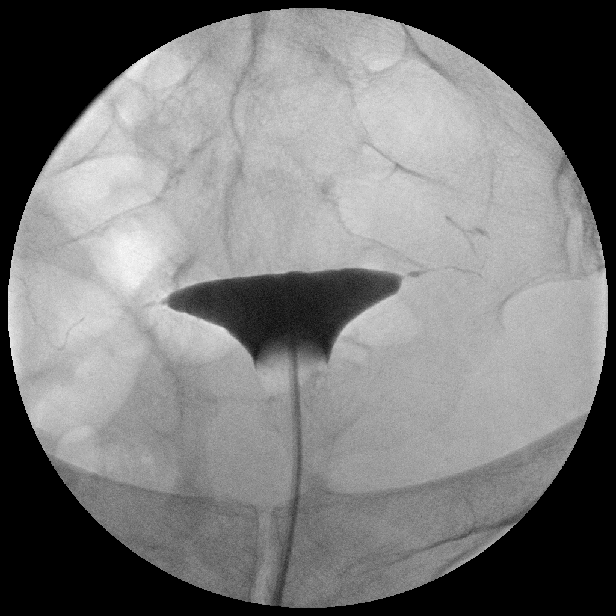
[im 2/2]
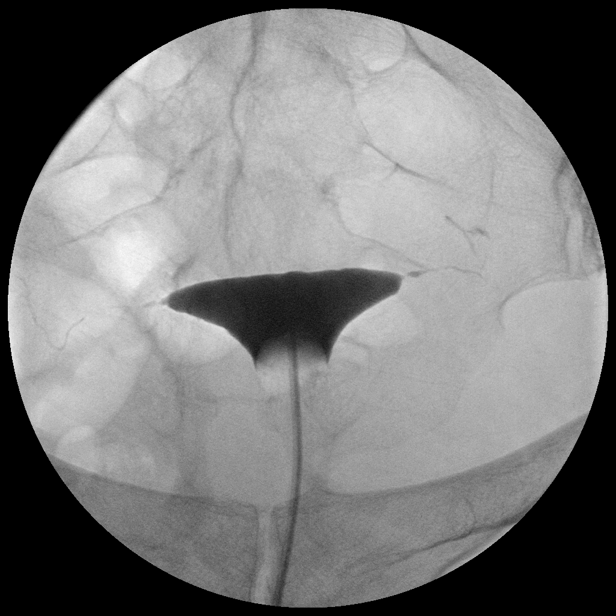

[Series 4: run · 2 of 2 slices shown (4 of 9)]
[im 1/2]
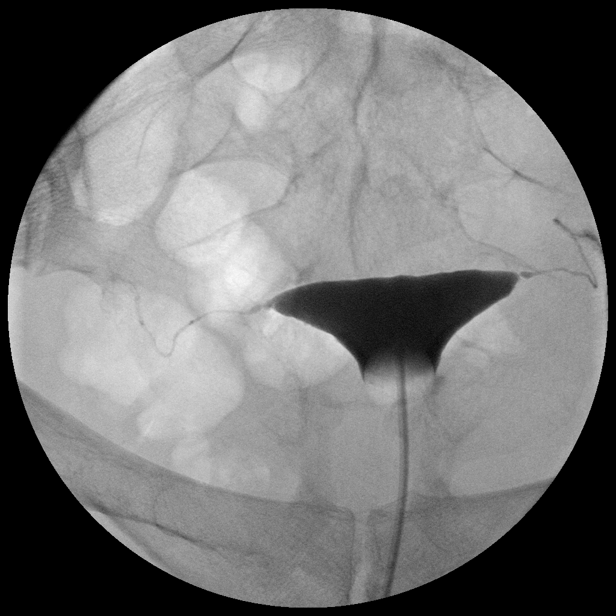
[im 2/2]
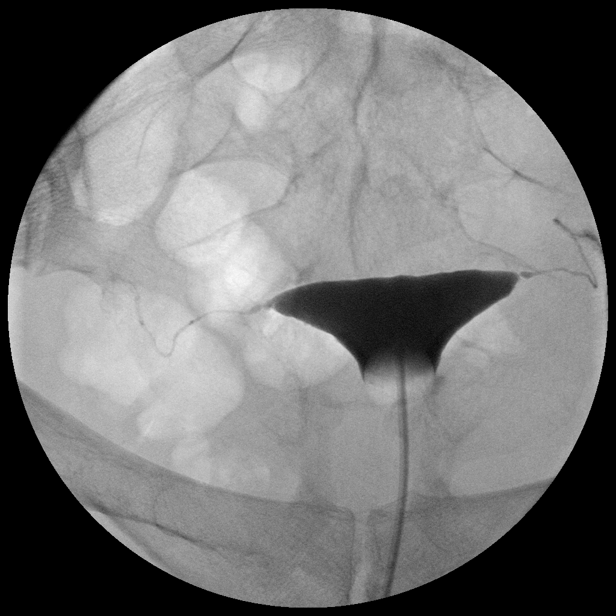

[Series 5: run · 1 of 2 slices shown (5 of 9)]
[im 2/2]
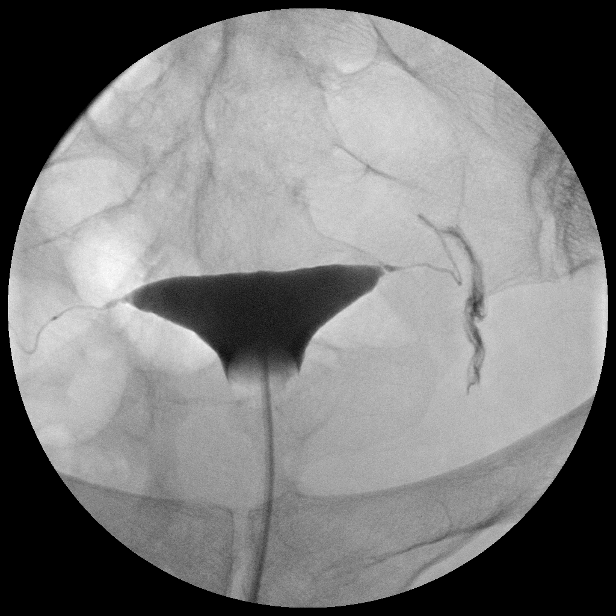

[Series 6: run · 2 of 2 slices shown (6 of 9)]
[im 1/2]
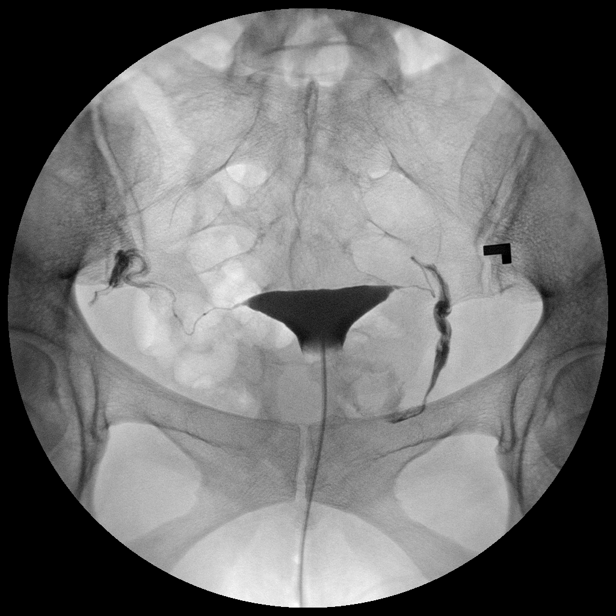
[im 2/2]
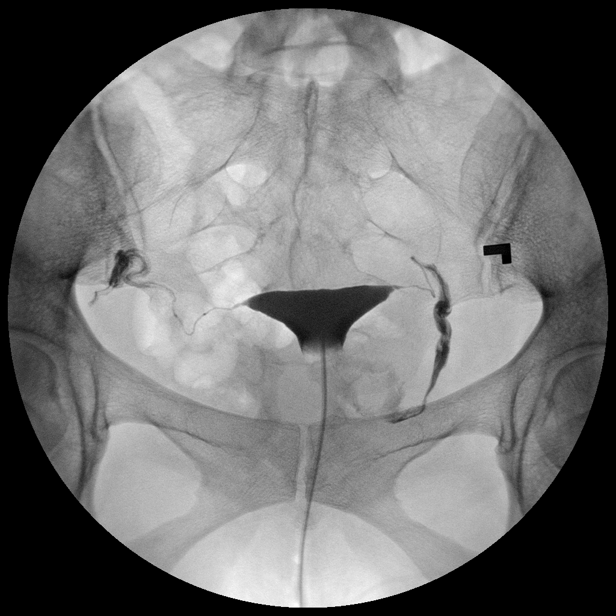

[Series 7: run · 2 of 2 slices shown (7 of 9)]
[im 1/2]
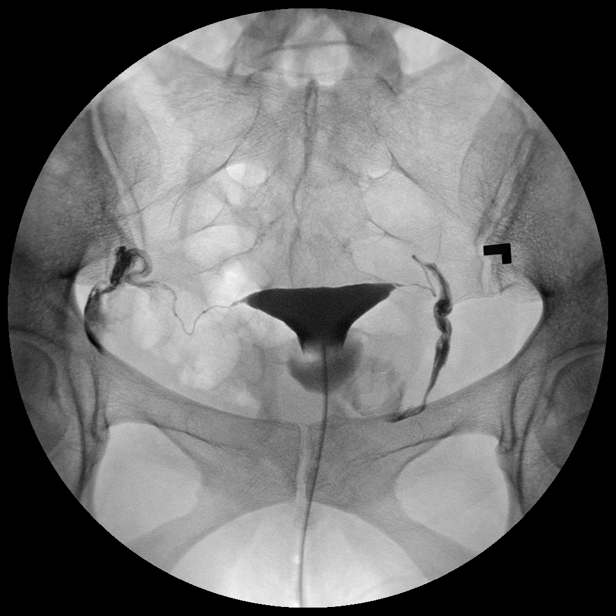
[im 2/2]
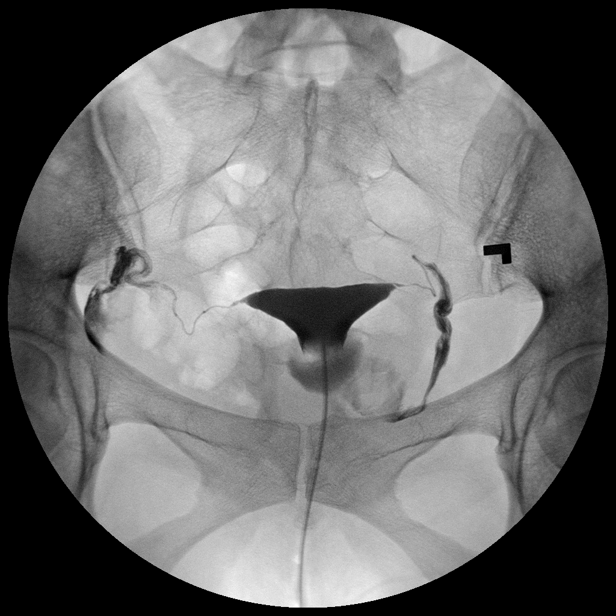

[Series 8: run · 1 of 2 slices shown (8 of 9)]
[im 2/2]
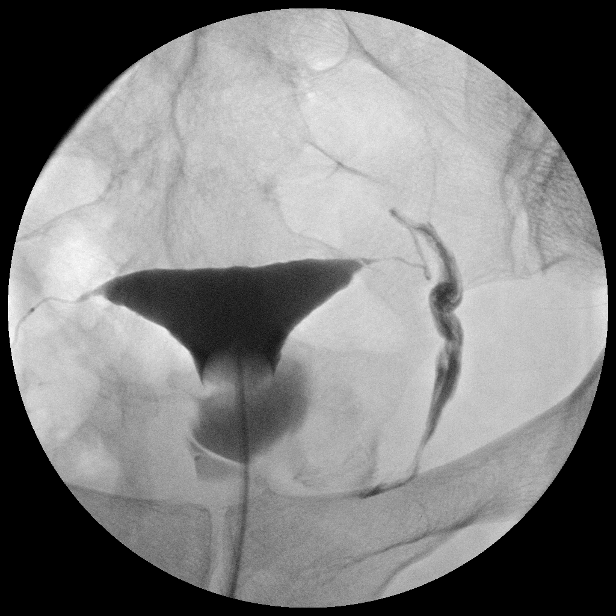

[Series 9: run · 2 of 2 slices shown (9 of 9)]
[im 1/2]
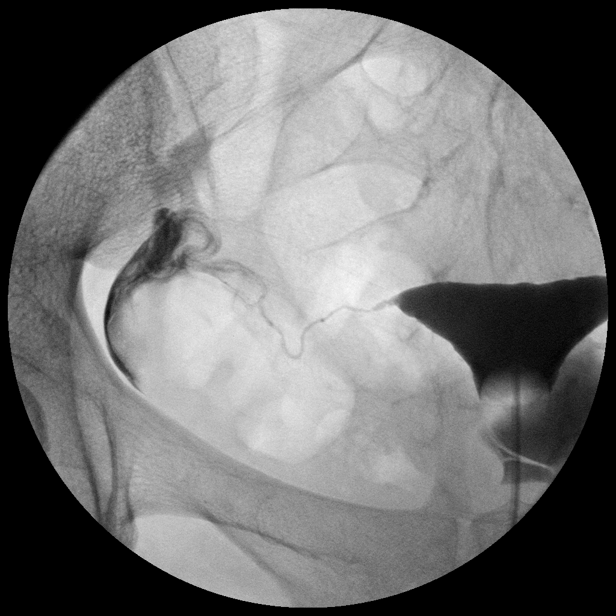
[im 2/2]
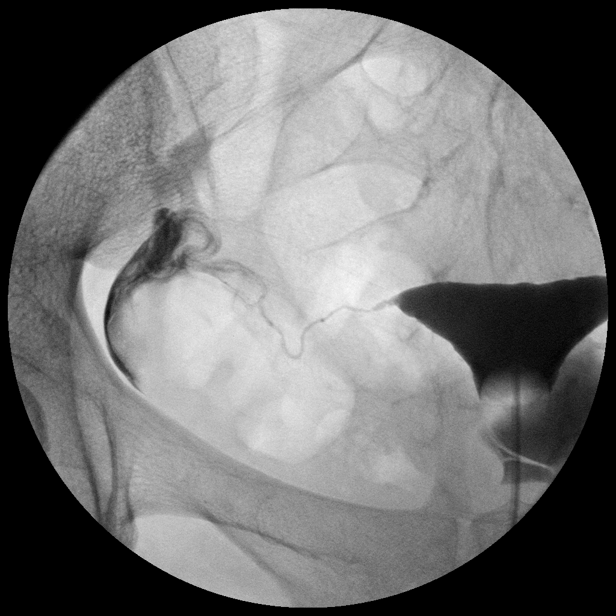

[15 of 18 positions shown; findings below may reference images not displayed]

FINDINGS: Normal early filling of the uterus.

Normal filling of the bilateral fallopian tubes.

Satisfactory peritoneal spill bilaterally.
IMPRESSION: Normal HSG.

## 2017-12-04 IMAGING — US US MFM OB DETAIL+14 WK
1 series · 13 of 28 positions shown · non-contrast
Comparison: none

[Series 1: us mfm ob detail+14 wk · 125 acquisitions, 13 frames shown]
[im 5/125]
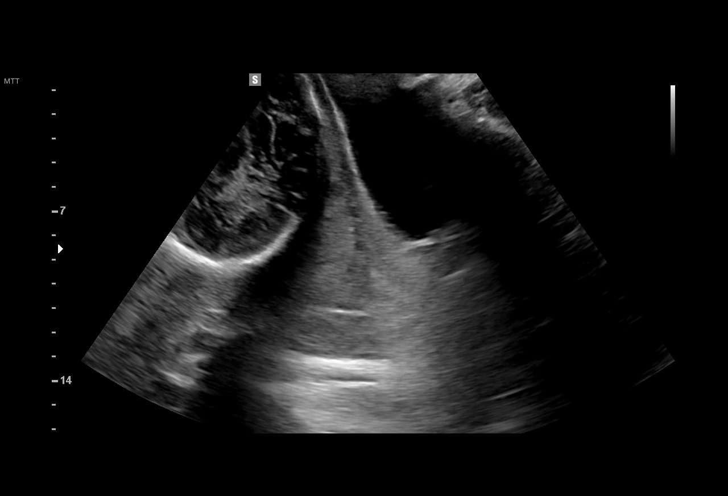
[im 14/125]
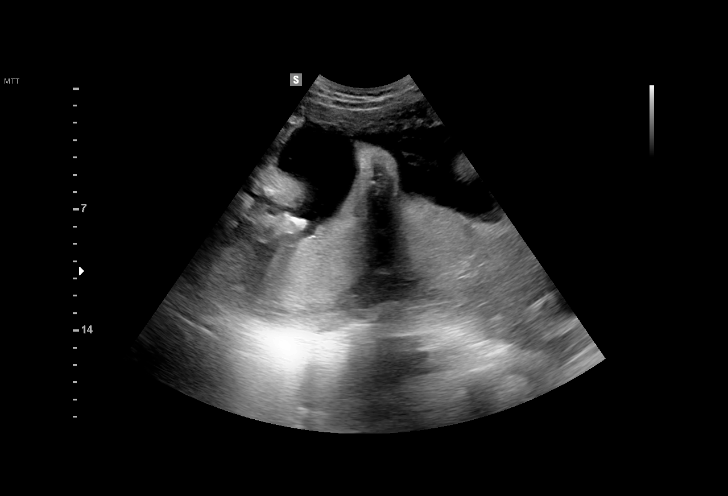
[im 23/125]
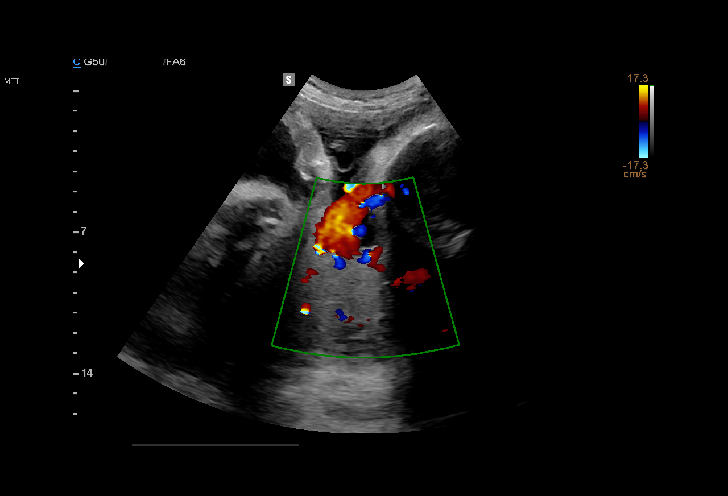
[im 33/125]
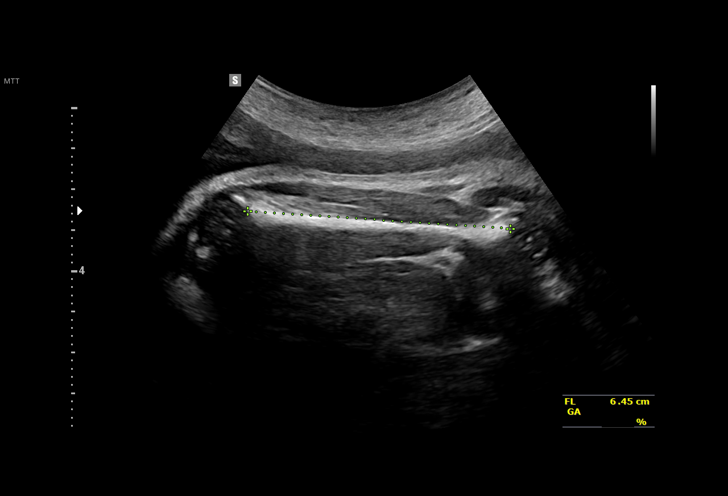
[im 42/125]
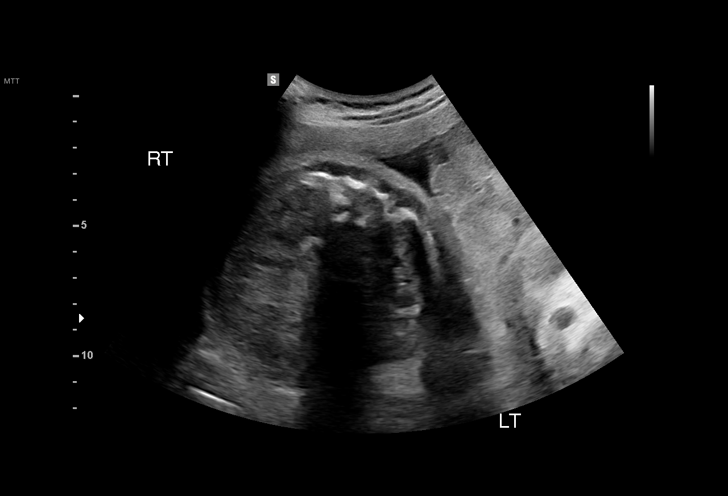
[im 51/125]
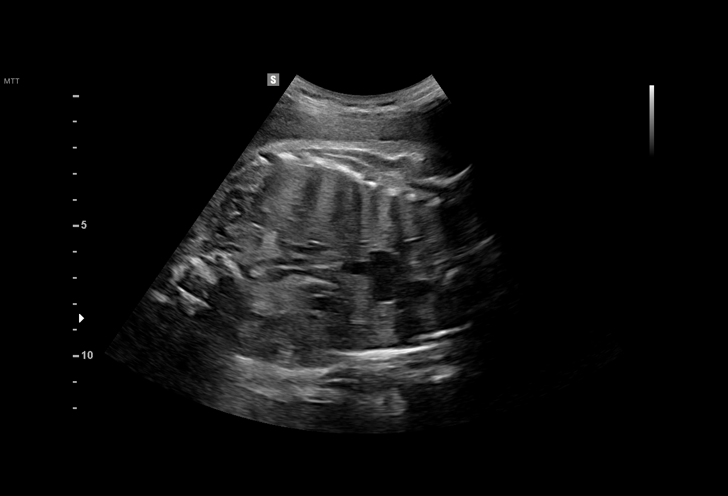
[im 65/125]
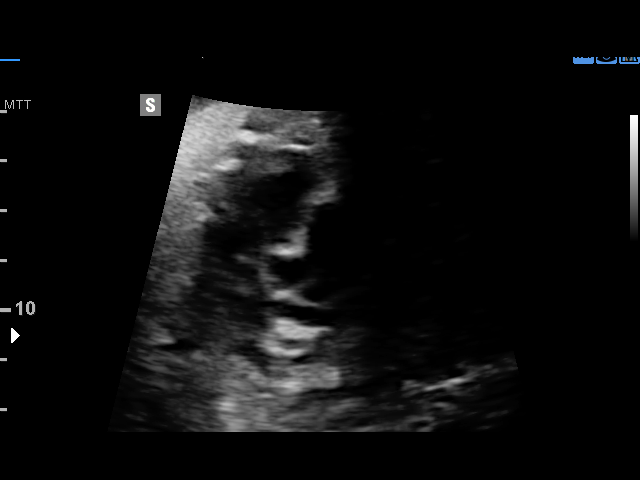
[im 74/125]
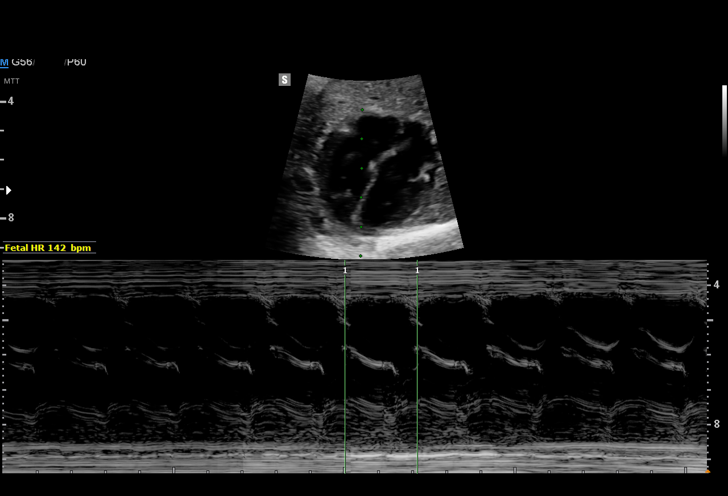
[im 83/125]
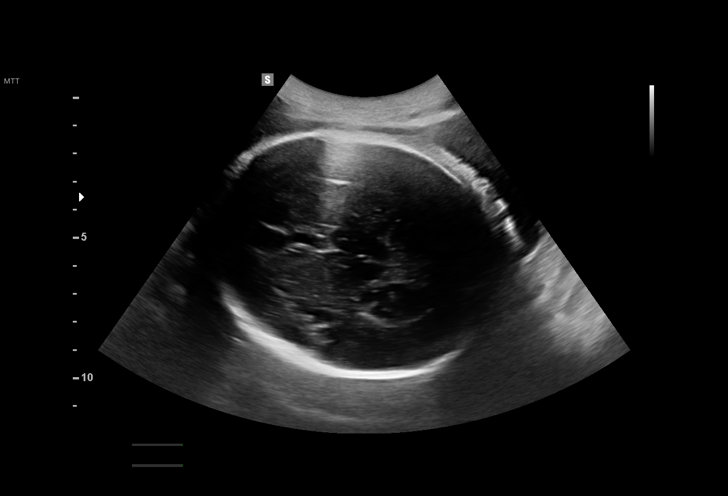
[im 92/125]
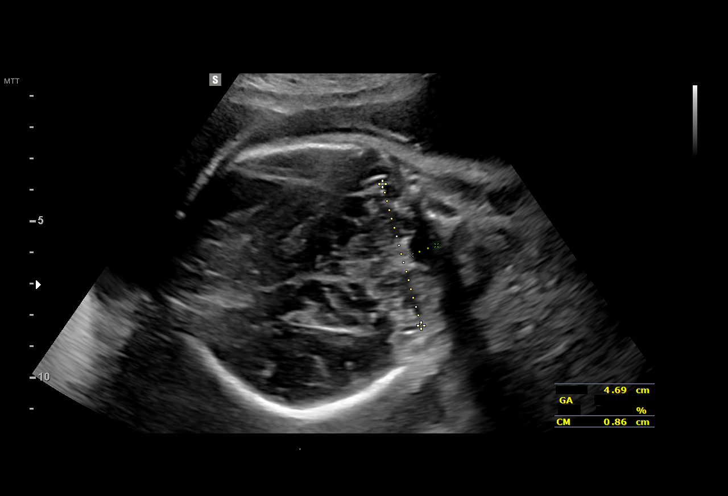
[im 102/125]
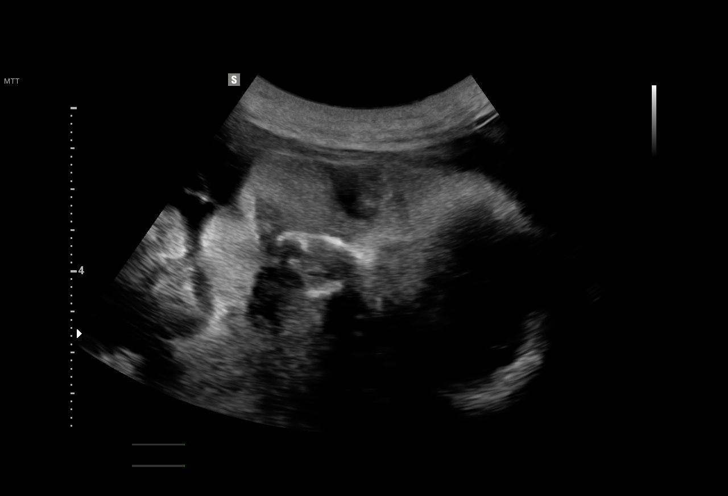
[im 111/125]
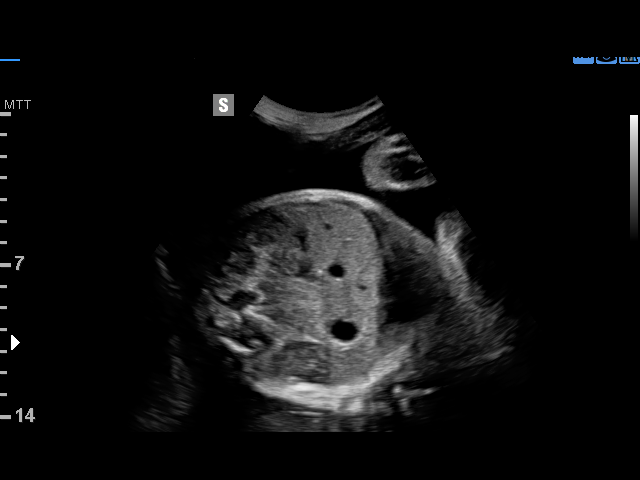
[im 120/125]
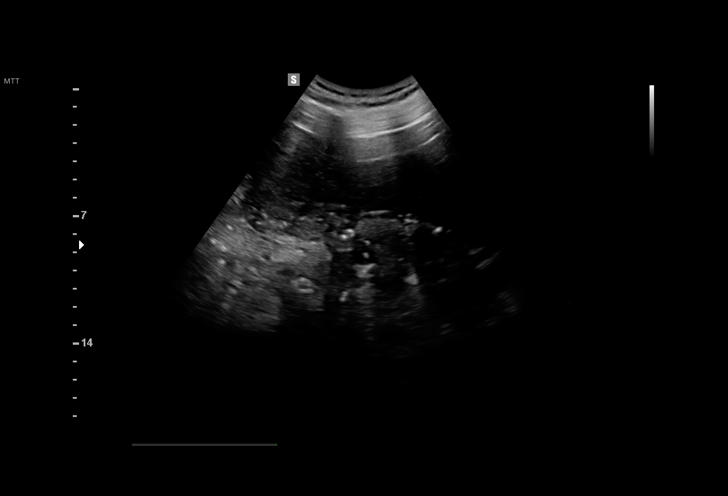

[13 of 28 positions shown; findings below may reference images not displayed]

Obstetrics &
Gynecology
[address]
[place].
Attending:        Jaeeun Zinner         Secondary Phy.:   ANTE Nursing-
Antenatal [REDACTED]9

1  CHAE ZAFAR           [id number]      [id number]     [id number]
Indications

33 weeks gestation of pregnancy
Hepatitis B complicating pregnancy             O98.419,
Placenta previa with hemorrhage, third
trimester (spotting yesterday)
Advanced maternal age primigravida 33+,
third trimester
OB History

Blood Type:            Height:  5'4"   Weight (lb):  142       BMI:
Gravidity:    1         Term:   0        Prem:   0        SAB:   0
TOP:          0       Ectopic:  0        Living: 0
Fetal Evaluation

Num Of Fetuses:     1
Fetal Heart         142
Rate(bpm):
Cardiac Activity:   Observed
Presentation:       Cephalic
Placenta:           Posterior Previa
P. Cord Insertion:  Visualized, central

Amniotic Fluid
AFI FV:      Subjectively within normal limits
AFI Sum(cm)     %Tile       Largest Pocket(cm)
14.41           51

RUQ(cm)       RLQ(cm)       LUQ(cm)        LLQ(cm)
4.86
Biometry

BPD:      83.1  mm     G. Age:  33w 3d         45  %    CI:        72.23   %    70 - 86
FL/HC:      20.4   %    19.9 -
HC:      311.1  mm     G. Age:  34w 6d         49  %    HC/AC:      1.00        0.96 -
AC:      311.8  mm     G. Age:  35w 1d         91  %    FL/BPD:     76.4   %    71 - 87
FL:       63.5  mm     G. Age:  32w 6d         24  %    FL/AC:      20.4   %    20 - 24
HUM:      55.3  mm     G. Age:  32w 1d         32  %
CER:      46.9  mm     G. Age:  N/A          > 95  %

CM:        8.6  mm

Est. FW:    5903  gm      5 lb 5 oz     76  %
Gestational Age

LMP:           41w 6d        Date:  04/08/15                 EDD:   01/13/16
Clinical EDD:  33w 3d                                        EDD:   03/12/16
U/S Today:     34w 1d                                        EDD:   03/07/16
Best:          33w 3d     Det. By:  Clinical EDD             EDD:   03/12/16
Anatomy

Cranium:               Appears normal         Aortic Arch:            Appears normal
Cavum:                 Appears normal         Ductal Arch:            Appears normal
Ventricles:            Appears normal         Diaphragm:              Appears normal
Choroid Plexus:        Appears normal         Stomach:                Appears normal, left
sided
Cerebellum:            Appears normal         Abdomen:                Appears normal
Posterior Fossa:       Appears normal         Abdominal Wall:         Appears nml (cord
insert, abd wall)
Nuchal Fold:           Not applicable (>20    Cord Vessels:           Appears normal (3
wks GA)                                        vessel cord)
Face:                  Appears normal         Kidneys:                Appear normal
(orbits and profile)
Lips:                  Appears normal         Bladder:                Appears normal
Thoracic:              Appears normal         Spine:                  Limited views
appear normal
Heart:                 Appears normal         Upper Extremities:      Visualized
(4CH, axis, and situs
RVOT:                  Appears normal         Lower Extremities:      Visualized
LVOT:                  Appears normal

Other:  Fetus appears to be a male. Nasal bone visualized. Technically
difficult due to advanced GA and fetal position.
Cervix Uterus Adnexa

Cervix
Not visualized (advanced GA >77wks)

Uterus
No abnormality visualized.

Left Ovary
No adnexal mass visualized.
Right Ovary
No adnexal mass visualized.

Cul De Sac:   No free fluid seen.

Adnexa:       No abnormality visualized.
Impression

Singleton intrauterine pregnancy at 33 weeks 3 days
gestation with fetal cardiac activity
Cephalic presentation
Posterior placenta previa
Normal appearing fetal growth and amniotic fluid volume
No apparent birth defects noted on anatomic survey,
although not all structures well visualized on todays
examination secondary to advanced gestational age and fetal
position
Recommendations

Recommend delivery 
36 weeks gestation or as clinically
indicated based on bleeding
